# Patient Record
Sex: Female | Born: 1945 | Race: White | Hispanic: No | Smoking: Never smoker
Health system: Southern US, Community
[De-identification: ages and names within clinical notes are randomized; demographics above are authoritative.]

## PROBLEM LIST (undated history)

## (undated) DIAGNOSIS — F3289 Other specified depressive episodes: Secondary | ICD-10-CM

## (undated) DIAGNOSIS — E039 Hypothyroidism, unspecified: Secondary | ICD-10-CM

## (undated) DIAGNOSIS — I10 Essential (primary) hypertension: Secondary | ICD-10-CM

## (undated) DIAGNOSIS — I639 Cerebral infarction, unspecified: Secondary | ICD-10-CM

## (undated) DIAGNOSIS — J189 Pneumonia, unspecified organism: Secondary | ICD-10-CM

## (undated) DIAGNOSIS — E7800 Pure hypercholesterolemia, unspecified: Secondary | ICD-10-CM

## (undated) DIAGNOSIS — C801 Malignant (primary) neoplasm, unspecified: Secondary | ICD-10-CM

## (undated) DIAGNOSIS — D649 Anemia, unspecified: Secondary | ICD-10-CM

## (undated) DIAGNOSIS — G459 Transient cerebral ischemic attack, unspecified: Secondary | ICD-10-CM

## (undated) DIAGNOSIS — F329 Major depressive disorder, single episode, unspecified: Secondary | ICD-10-CM

## (undated) DIAGNOSIS — E119 Type 2 diabetes mellitus without complications: Secondary | ICD-10-CM

## (undated) DIAGNOSIS — Z8719 Personal history of other diseases of the digestive system: Secondary | ICD-10-CM

## (undated) HISTORY — DX: Essential (primary) hypertension: I10

## (undated) HISTORY — DX: Hypothyroidism, unspecified: E03.9

## (undated) HISTORY — DX: Type 2 diabetes mellitus without complications: E11.9

## (undated) HISTORY — PX: BACK SURGERY: SHX140

## (undated) HISTORY — DX: Other specified depressive episodes: F32.89

## (undated) HISTORY — DX: Major depressive disorder, single episode, unspecified: F32.9

## (undated) HISTORY — DX: Pure hypercholesterolemia, unspecified: E78.00

## (undated) HISTORY — PX: COLONOSCOPY: SHX174

## (undated) HISTORY — PX: EYE SURGERY: SHX253

## (undated) HISTORY — PX: TUBAL LIGATION: SHX77

## (undated) HISTORY — PX: CATARACT EXTRACTION, BILATERAL: SHX1313

---

## 2004-11-03 ENCOUNTER — Ambulatory Visit (HOSPITAL_COMMUNITY): Admission: RE | Admit: 2004-11-03 | Discharge: 2004-11-03 | Payer: Self-pay | Admitting: Internal Medicine

## 2004-11-03 ENCOUNTER — Ambulatory Visit: Payer: Self-pay | Admitting: Internal Medicine

## 2010-05-15 ENCOUNTER — Ambulatory Visit: Payer: Self-pay | Admitting: Internal Medicine

## 2010-05-15 ENCOUNTER — Ambulatory Visit (HOSPITAL_COMMUNITY): Admission: RE | Admit: 2010-05-15 | Discharge: 2010-05-15 | Payer: Self-pay | Admitting: Internal Medicine

## 2010-09-09 LAB — GLUCOSE, CAPILLARY: Glucose-Capillary: 171 mg/dL — ABNORMAL HIGH (ref 70–99)

## 2010-11-14 NOTE — Op Note (Signed)
[name], Carol Wood              ACCOUNT NO.:  [id number]   MEDICAL RECORD NO.:  [id number]          PATIENT TYPE:  AMB   LOCATION:  DAY                           FACILITY:  [facility]   PHYSICIAN:  Lionel December, M.D.    DATE OF BIRTH:  1945/12/25   DATE OF PROCEDURE:  11/03/2004  DATE OF DISCHARGE:                                 OPERATIVE REPORT   PROCEDURE:  Colonoscopy with polypectomy.   ENDOSCOPIST:  Lionel December, M.D.   INDICATIONS FOR PROCEDURE:  The patient is a 66 year old Caucasian female  who is here for a screening colonoscopy.  The procedure and risks were  reviewed with the patient and an informed consent was obtained.   PREOPERATIVE MEDICATIONS:  Demerol 50 mg IV, Versed 7 mg IV in divided dose.   FINDINGS/DESCRIPTION OF PROCEDURE:  The procedure was performed in the  endoscopy suite.  The patient's vital signs and O2 saturations were  monitored during the procedure and remained stable.  The patient was placed  in the left lateral recumbent position and a rectal examination was  performed.  She had a soft sentinel skin tag.  A digital exam was normal.  The Olympus videoscope was placed in the rectum and advanced under vision  into the sigmoid colon and beyond.  __________ the hepatic flexure.  There  was a small polyp which was snared and retrieved for histologic examination.  The scope was passed into the cecum which was identified by the ileocecal  valve.  The blunt cecum was coated with thick stool, not well seen; however,  there did not appear to be anything underneath.  Pictures taken for the  record.  As the scope was withdrawn, the colonic mucosa was once again  carefully examined.  There were no other abnormalities.  The rectal mucosa  similarly was normal.  The scope was retroflexed and examined in the rectum,  and small hemorrhoids were noted below the dentate line.  The endoscope was  straightened and withdrawn.   The patient tolerated the procedure  well.   FINAL DIAGNOSES:  1.  Single small polyp, snared from the hepatic flexure.  2.  Small external hemorrhoids.  3.  A skin tag.   RECOMMENDATIONS:  Standard instructions given.  I will be contacting the  patient with the biopsy results and further recommendations.      NR/MEDQ  D:  11/03/2004  T:  11/03/2004  Job:  [id number]   cc:   Doreen Beam  [address]  [place]  [place] [address]  Fax: [phone]

## 2011-09-01 ENCOUNTER — Encounter: Payer: Self-pay | Admitting: *Deleted

## 2011-09-02 ENCOUNTER — Ambulatory Visit (INDEPENDENT_AMBULATORY_CARE_PROVIDER_SITE_OTHER): Payer: Medicare Other | Admitting: Cardiology

## 2011-09-02 ENCOUNTER — Telehealth: Payer: Self-pay | Admitting: *Deleted

## 2011-09-02 ENCOUNTER — Encounter: Payer: Self-pay | Admitting: *Deleted

## 2011-09-02 ENCOUNTER — Encounter: Payer: Self-pay | Admitting: Cardiology

## 2011-09-02 VITALS — BP 143/82 | HR 77 | Ht 60.0 in | Wt 176.0 lb

## 2011-09-02 DIAGNOSIS — R0602 Shortness of breath: Secondary | ICD-10-CM

## 2011-09-02 DIAGNOSIS — R0600 Dyspnea, unspecified: Secondary | ICD-10-CM | POA: Insufficient documentation

## 2011-09-02 DIAGNOSIS — E119 Type 2 diabetes mellitus without complications: Secondary | ICD-10-CM

## 2011-09-02 DIAGNOSIS — E7800 Pure hypercholesterolemia, unspecified: Secondary | ICD-10-CM

## 2011-09-02 DIAGNOSIS — I10 Essential (primary) hypertension: Secondary | ICD-10-CM

## 2011-09-02 NOTE — Assessment & Plan Note (Signed)
The blood pressure is slightly higher than  target. No change in medications is indicated. We will continue with therapeutic lifestyle changes (TLC).  I suspect that weight loss will bring her to target.

## 2011-09-02 NOTE — Assessment & Plan Note (Signed)
The patient describes dyspnea that could easily be an anginal equivalent. Given this stress testing is indicated. She does not think she would be a little walk on a treadmill. Therefore, she will have a [employer].

## 2011-09-02 NOTE — Telephone Encounter (Signed)
lexiscan myoview Scheduled for 09-14-11 @ [facility] Checking percert

## 2011-09-02 NOTE — Assessment & Plan Note (Signed)
This is followed by Dr. Talmage Nap

## 2011-09-02 NOTE — Patient Instructions (Signed)
Your follow up will be based on your test results. Your physician recommends that you continue on your current medications as directed. Please refer to the Current Medication list given to you today. Your physician has requested that you have a lexiscan myoview. For further information please visit [email or web address]. Please follow instruction sheet, as given.  If the results of your test are normal or stable, you will receive a letter. If they are abnormal, the nurse will contact you by phone.  

## 2011-09-02 NOTE — Progress Notes (Signed)
HPI The patient presents for evaluation of dyspnea. She has not had prior cardiac history. However, she has had significant cardiovascular risk factors. She has a strong family history of coronary disease. She was referred by her endocrinologist to further consider coronary disease as an etiology for her dyspnea. The patient reports that she has noticed on occasion but she's breathing heavy. More she hears other people tell her that she looks like she is breathing heavy with exertion. She says she can play with her dogs and walk a fair distance to the mailbox and will be breathing heavy. She doesn't describe any chest pressure, neck or arm discomfort. She doesn't describe palpitations, presyncope or syncope. She's had no PND or orthopnea. She's had no weight gain or edema.  No Known Allergies  Current Outpatient Prescriptions  Medication Sig Dispense Refill  . aspirin 325 MG tablet Take 325 mg by mouth daily.      Marland Kitchen b complex vitamins tablet Take 1 tablet by mouth daily.      . Calcium Carbonate-Vit D-Min (CALTRATE 600+D PLUS) 600-400 MG-UNIT per tablet Take 1 tablet by mouth daily.      . Coenzyme Q10 (COQ10) 100 MG CAPS Take 1 capsule by mouth daily.      . fish oil-omega-3 fatty acids 1000 MG capsule Take 2 g by mouth 2 (two) times daily.       Marland Kitchen glipiZIDE (GLUCOTROL XL) 5 MG 24 hr tablet Take 5 mg by mouth daily.      Marland Kitchen glucosamine-chondroitin 500-400 MG tablet Take 1 tablet by mouth 2 (two) times daily.      Marland Kitchen levothyroxine (SYNTHROID, LEVOTHROID) 50 MCG tablet Take 50 mcg by mouth daily.      Marland Kitchen lisinopril (PRINIVIL,ZESTRIL) 20 MG tablet Take 20 mg by mouth daily.      . Multiple Vitamin (MULTIVITAMIN) tablet Take 1 tablet by mouth daily.      Marland Kitchen omeprazole (PRILOSEC) 20 MG capsule Take 1 capsule by mouth Daily.      Marland Kitchen PARoxetine (PAXIL) 20 MG tablet Take 20 mg by mouth daily.      . simvastatin (ZOCOR) 40 MG tablet Take 40 mg by mouth every evening.      . sitaGLIPtan-metformin (JANUMET)  50-1000 MG per tablet Take 1 tablet by mouth 2 (two) times daily with a meal.        Past Medical History  Diagnosis Date  . Type II or unspecified type diabetes mellitus without mention of complication, not stated as uncontrolled   . Unspecified essential hypertension   . Pure hypercholesterolemia   . Hypothyroidism   . Depressive disorder, not elsewhere classified     Past Surgical History  Procedure Date  . Back surgery     ROS:  As stated in the HPI and negative for all other systems.  PHYSICAL EXAM BP 143/82  Pulse 77  Ht 5' (1.524 m)  Wt 176 lb (79.833 kg)  BMI 34.37 kg/m2  SpO2 98% GENERAL:  Well appearing HEENT:  Pupils equal round and reactive, fundi not visualized, oral mucosa unremarkable NECK:  No jugular venous distention, waveform within normal limits, carotid upstroke brisk and symmetric, no bruits, no thyromegaly LYMPHATICS:  No cervical, inguinal adenopathy LUNGS:  Clear to auscultation bilaterally BACK:  No CVA tenderness CHEST:  Unremarkable HEART:  PMI not displaced or sustained,S1 and S2 within normal limits, no S3, no S4, no clicks, no rubs, no murmurs ABD:  Flat, positive bowel sounds normal in frequency in  pitch, no bruits, no rebound, no guarding, no midline pulsatile mass, no hepatomegaly, no splenomegaly EXT:  2 plus pulses throughout, no edema, no cyanosis no clubbing SKIN:  No rashes no nodules NEURO:  Cranial nerves II through XII grossly intact, motor grossly intact throughout PSYCH:  Cognitively intact, oriented to person place and time  EKG:  Sinus rhythm, rate 78, axis within normal limits, intervals within normal limits, no acute ST-T wave changes.  Poor anterior R wave progression. 09/02/2011  ASSESSMENT AND PLAN

## 2011-09-08 NOTE — Telephone Encounter (Signed)
Pt has [employer] MCR Complete.  WUJW#[id number] Exp 10-23-11

## 2011-09-14 DIAGNOSIS — R079 Chest pain, unspecified: Secondary | ICD-10-CM

## 2011-09-18 ENCOUNTER — Encounter: Payer: Self-pay | Admitting: *Deleted

## 2014-08-02 ENCOUNTER — Encounter (INDEPENDENT_AMBULATORY_CARE_PROVIDER_SITE_OTHER): Payer: Self-pay | Admitting: *Deleted

## 2014-09-03 ENCOUNTER — Other Ambulatory Visit (INDEPENDENT_AMBULATORY_CARE_PROVIDER_SITE_OTHER): Payer: Self-pay | Admitting: *Deleted

## 2014-09-03 ENCOUNTER — Ambulatory Visit (INDEPENDENT_AMBULATORY_CARE_PROVIDER_SITE_OTHER): Payer: Medicare Other | Admitting: Internal Medicine

## 2014-09-03 ENCOUNTER — Encounter (INDEPENDENT_AMBULATORY_CARE_PROVIDER_SITE_OTHER): Payer: Self-pay | Admitting: *Deleted

## 2014-09-03 ENCOUNTER — Encounter (INDEPENDENT_AMBULATORY_CARE_PROVIDER_SITE_OTHER): Payer: Self-pay | Admitting: Internal Medicine

## 2014-09-03 VITALS — BP 112/62 | HR 60 | Temp 98.6°F | Ht 61.6 in | Wt 177.8 lb

## 2014-09-03 DIAGNOSIS — R1314 Dysphagia, pharyngoesophageal phase: Secondary | ICD-10-CM

## 2014-09-03 DIAGNOSIS — Z800 Family history of malignant neoplasm of digestive organs: Secondary | ICD-10-CM

## 2014-09-03 DIAGNOSIS — R1310 Dysphagia, unspecified: Secondary | ICD-10-CM

## 2014-09-03 NOTE — Progress Notes (Signed)
   Subjective:    Patient ID: Carol Wood, female    DOB: 11/21/1945, 69 y.o.   MRN: [id number]  HPI Referred to our office by Dr. Woody Seller for dysphagia. She says she has had GERD. She has had acid reflux for at least 3-4 years. Started Omeprazole about 3 yrs ago and controlled her GERD for the most time. She says if she skipped a day she would have acid reflux.  Recently given an RX for Protonix but has not started y;e;t.  She says meats are giving her problems swallowing. Meats are slow to go down. She chews well and takes her time eating. Denies prior hx of dysphagia. Appetite is good. No weight loss. No abdominal pain. Usually has a BM once a day. She says she eats a lot of fiber. No melena or BRRB. Last colonoscopy was in 2011 with two polyps. Biopsy HYPERPLASTIC POLYP(S).NO ADENOMATOUS CHANGE OR MALIGNANCY IDENTIFIED.  2. Colon, polyp(s), proximal sigmoid : TUBULAR ADENOMA.NO HIGH GRADE DYSPLASIA OR MALIGNANCY IDENTIFIED.    Family hx of colon cancer in a brother age 67 (deceased). Family hx of stomach aunt and grandfather.   Review of Systems Widowed. Three boy. Two have CAD. One son in good health    Objective:   Physical Exam  Filed Vitals:   09/03/14 1105  Height: 5' 1.6" (1.565 m)  Weight: 177 lb 12.8 oz (80.65 kg)   Alert and oriented. Skin warm and dry. Oral mucosa is moist.   . Sclera anicteric, conjunctivae is pink. Thyroid not enlarged. No cervical lymphadenopathy. Lungs clear. Heart regular rate and rhythm.  Abdomen is soft. Bowel sounds are positive. No hepatomegaly. No abdominal masses felt. No tenderness.  No edema to lower extremities.         Assessment & Plan:  Dysphagia. Stricture needs to be ruled out as well as PU Will put on a recall for a colonoscopy in November.

## 2014-09-03 NOTE — Patient Instructions (Signed)
EGD/ED. The risks and benefits such as perforation, bleeding, and infection were reviewed with the patient and is agreeable. 

## 2014-09-06 ENCOUNTER — Ambulatory Visit (HOSPITAL_COMMUNITY)
Admission: RE | Admit: 2014-09-06 | Discharge: 2014-09-06 | Disposition: A | Discharge status: Home / Self Care | Payer: Medicare Other | Source: Ambulatory Visit | Attending: Internal Medicine | Admitting: Internal Medicine

## 2014-09-06 ENCOUNTER — Encounter (HOSPITAL_COMMUNITY): Payer: Self-pay | Admitting: *Deleted

## 2014-09-06 ENCOUNTER — Encounter (HOSPITAL_COMMUNITY)
Admission: RE | Disposition: A | Discharge status: Home / Self Care | Payer: Self-pay | Source: Ambulatory Visit | Attending: Internal Medicine

## 2014-09-06 DIAGNOSIS — E780 Pure hypercholesterolemia: Secondary | ICD-10-CM | POA: Diagnosis not present

## 2014-09-06 DIAGNOSIS — Z7982 Long term (current) use of aspirin: Secondary | ICD-10-CM | POA: Diagnosis not present

## 2014-09-06 DIAGNOSIS — E119 Type 2 diabetes mellitus without complications: Secondary | ICD-10-CM | POA: Diagnosis not present

## 2014-09-06 DIAGNOSIS — Z794 Long term (current) use of insulin: Secondary | ICD-10-CM | POA: Diagnosis not present

## 2014-09-06 DIAGNOSIS — E039 Hypothyroidism, unspecified: Secondary | ICD-10-CM | POA: Diagnosis not present

## 2014-09-06 DIAGNOSIS — I10 Essential (primary) hypertension: Secondary | ICD-10-CM | POA: Diagnosis not present

## 2014-09-06 DIAGNOSIS — F329 Major depressive disorder, single episode, unspecified: Secondary | ICD-10-CM | POA: Diagnosis not present

## 2014-09-06 DIAGNOSIS — K219 Gastro-esophageal reflux disease without esophagitis: Secondary | ICD-10-CM

## 2014-09-06 DIAGNOSIS — Z79899 Other long term (current) drug therapy: Secondary | ICD-10-CM | POA: Insufficient documentation

## 2014-09-06 DIAGNOSIS — K3189 Other diseases of stomach and duodenum: Secondary | ICD-10-CM | POA: Diagnosis not present

## 2014-09-06 DIAGNOSIS — K449 Diaphragmatic hernia without obstruction or gangrene: Secondary | ICD-10-CM | POA: Insufficient documentation

## 2014-09-06 DIAGNOSIS — R1310 Dysphagia, unspecified: Secondary | ICD-10-CM | POA: Diagnosis not present

## 2014-09-06 HISTORY — PX: ESOPHAGEAL DILATION: SHX303

## 2014-09-06 HISTORY — PX: ESOPHAGOGASTRODUODENOSCOPY: SHX5428

## 2014-09-06 LAB — GLUCOSE, CAPILLARY: GLUCOSE-CAPILLARY: 172 mg/dL — AB (ref 70–99)

## 2014-09-06 SURGERY — EGD (ESOPHAGOGASTRODUODENOSCOPY)
Anesthesia: Moderate Sedation

## 2014-09-06 MED ORDER — MEPERIDINE HCL 50 MG/ML IJ SOLN
INTRAMUSCULAR | Status: AC
  Filled 2014-09-06: qty 1

## 2014-09-06 MED ORDER — BUTAMBEN-TETRACAINE-BENZOCAINE 2-2-14 % EX AERO
INHALATION_SPRAY | CUTANEOUS | Status: DC | PRN
  Administered 2014-09-06: 2 via TOPICAL

## 2014-09-06 MED ORDER — MEPERIDINE HCL 50 MG/ML IJ SOLN
INTRAMUSCULAR | Status: DC | PRN
  Administered 2014-09-06 (×2): 25 mg via INTRAVENOUS

## 2014-09-06 MED ORDER — MIDAZOLAM HCL 5 MG/5ML IJ SOLN
INTRAMUSCULAR | Status: DC | PRN
  Administered 2014-09-06: 2 mg via INTRAVENOUS
  Administered 2014-09-06: 1 mg via INTRAVENOUS
  Administered 2014-09-06: 2 mg via INTRAVENOUS
  Administered 2014-09-06: 1 mg via INTRAVENOUS
  Administered 2014-09-06: 2 mg via INTRAVENOUS

## 2014-09-06 MED ORDER — STERILE WATER FOR IRRIGATION IR SOLN
Status: DC | PRN
  Administered 2014-09-06: 14:00:00

## 2014-09-06 MED ORDER — SODIUM CHLORIDE 0.9 % IV SOLN
INTRAVENOUS | Status: DC
  Administered 2014-09-06: 14:00:00 via INTRAVENOUS

## 2014-09-06 MED ORDER — MIDAZOLAM HCL 5 MG/5ML IJ SOLN
INTRAMUSCULAR | Status: AC
  Filled 2014-09-06: qty 10

## 2014-09-06 NOTE — H&P (Addendum)
Carol Wood is an 69 y.o. female.   Chief Complaint:  Patient is here for EGD and ED HPI:  Patient is 69 year-old Caucasian female who presents with history of dysphagia about 3 years. She has difficulty with solids. She points to suprasternal area soft bolus obstruction. She seemed to swallow better when she stays in her PPI. She's had heartburn regurgitation and cough for about 4 years. She denies nausea vomiting epigastric pain melena or weight loss.  Past Medical History  Diagnosis Date  . Type II or unspecified type diabetes mellitus without mention of complication, not stated as uncontrolled   . Unspecified essential hypertension   . Pure hypercholesterolemia   . Hypothyroidism   . Depressive disorder, not elsewhere classified     Past Surgical History  Procedure Laterality Date  . Back surgery    . Tubal ligation      Family History  Problem Relation Age of Onset  . Heart disease Son 47    CABG  . Heart disease Son 43    CABG  . Cancer Mother 10    Lung   Social History:  reports that she has never smoked. She has never used smokeless tobacco. She reports that she does not drink alcohol or use illicit drugs.  Allergies: No Known Allergies  Medications Prior to Admission  Medication Sig Dispense Refill  . acidophilus (RISAQUAD) CAPS capsule Take 1 capsule by mouth daily.    . APPLE CIDER VINEGAR PO Take 2 tablets by mouth daily.    Marland Kitchen aspirin 325 MG tablet Take 325 mg by mouth daily.    Marland Kitchen b complex vitamins tablet Take 1 tablet by mouth daily.    . cholecalciferol (VITAMIN D) 400 UNITS TABS tablet Take 400 Units by mouth daily.    . fish oil-omega-3 fatty acids 1000 MG capsule Take 2 g by mouth 2 (two) times daily.     Marland Kitchen glipiZIDE (GLUCOTROL XL) 10 MG 24 hr tablet Take 10 mg by mouth 2 (two) times daily.    Marland Kitchen glucosamine-chondroitin 500-400 MG tablet Take 1 tablet by mouth 2 (two) times daily.    . insulin glargine (LANTUS) 100 UNIT/ML injection Inject 10 Units into  the skin at bedtime. 10 units in evening    . levothyroxine (SYNTHROID, LEVOTHROID) 50 MCG tablet Take 50 mcg by mouth daily.    Marland Kitchen lisinopril (PRINIVIL,ZESTRIL) 20 MG tablet Take 20 mg by mouth 2 (two) times daily.     . Magnesium 250 MG TABS Take 1 tablet by mouth daily.    . metFORMIN (GLUCOPHAGE) 500 MG tablet Take 1,000 mg by mouth 2 (two) times daily with a meal.    . Misc Natural Products (SUPER GRAPEFRUIT N FIBER DIET) TABS Take 3 tablets by mouth 2 (two) times daily.    . Multiple Vitamin (MULTIVITAMIN) tablet Take 1 tablet by mouth daily.    . Multiple Vitamins-Minerals (VITEYES AREDS FORMULA PO) Take 1 tablet by mouth 2 (two) times daily.    Marland Kitchen omeprazole (PRILOSEC) 20 MG capsule Take 1 capsule by mouth Daily.    Marland Kitchen PARoxetine (PAXIL) 20 MG tablet Take 20 mg by mouth daily.      Results for orders placed or performed during the hospital encounter of 09/06/14 (from the past 48 hour(s))  Glucose, capillary     Status: Abnormal   Collection Time: 09/06/14  1:52 PM  Result Value Ref Range   Glucose-Capillary 172 (H) 70 - 99 mg/dL   No results found.  ROS  Blood pressure 170/77, pulse 82, temperature 98 F (36.7 C), temperature source Oral, resp. rate 18, SpO2 98 %. Physical Exam  Constitutional: She appears well-developed and well-nourished.  HENT:  Mouth/Throat: Oropharynx is clear and moist.  Eyes: Conjunctivae are normal. No scleral icterus.  Neck: No thyromegaly present.  Cardiovascular: Regular rhythm and normal heart sounds.   No murmur heard. Respiratory: Effort normal and breath sounds normal.  GI: Soft. She exhibits no distension and no mass. There is no tenderness.  Musculoskeletal: She exhibits no edema.  Lymphadenopathy:    She has no cervical adenopathy.  Neurological: She is alert.  Skin: Skin is warm and dry.     Assessment/Plan  Solid food dysphagia in patient with chronic GERD.  EGD with ED.  REHMAN,NAJEEB U 09/06/2014, 2:23 PM

## 2014-09-06 NOTE — Discharge Instructions (Signed)
Resume usual medications and diet.  No driving for 24 hours  Physician will call with biopsy results.  Dysphagia Swallowing problems (dysphagia) occur when solids and liquids seem to stick in your throat on the way down to your stomach, or the food takes longer to get to the stomach. Other symptoms include regurgitating food, noises coming from the throat, chest discomfort with swallowing, and a feeling of fullness or the feeling of something being stuck in your throat when swallowing. When blockage in your throat is complete, it may be associated with drooling. CAUSES  Problems with swallowing may occur because of problems with the muscles. The food cannot be propelled in the usual manner into your stomach. You may have ulcers, scar tissue, or inflammation in the tube down which food travels from your mouth to your stomach (esophagus), which blocks food from passing normally into the stomach. Causes of inflammation include:  Acid reflux from your stomach into your esophagus.  Infection.  Radiation treatment for cancer.  Medicines taken without enough fluids to wash them down into your stomach. You may have nerve problems that prevent signals from being sent to the muscles of your esophagus to contract and move your food down to your stomach. Globus pharyngeus is a relatively common problem in which there is a sense of an obstruction or difficulty in swallowing, without any physical abnormalities of the swallowing passages being found. This problem usually improves over time with reassurance and testing to rule out other causes. DIAGNOSIS Dysphagia can be diagnosed and its cause can be determined by tests in which you swallow a white substance that helps illuminate the inside of your throat (contrast medium) while X-rays are taken. Sometimes a flexible telescope that is inserted down your throat (endoscopy) to look at your esophagus and stomach is used. TREATMENT   If the dysphagia is caused by  acid reflux or infection, medicines may be used.  If the dysphagia is caused by problems with your swallowing muscles, swallowing therapy may be used to help you strengthen your swallowing muscles.  If the dysphagia is caused by a blockage or mass, procedures to remove the blockage may be done. HOME CARE INSTRUCTIONS  Try to eat soft food that is easier to swallow and check your weight on a daily basis to be sure that it is not decreasing.  Be sure to drink liquids when sitting upright (not lying down). SEEK MEDICAL CARE IF:  You are losing weight because you are unable to swallow.  You are coughing when you drink liquids (aspiration).  You are coughing up partially digested food. SEEK IMMEDIATE MEDICAL CARE IF:  You are unable to swallow your own saliva .  You are having shortness of breath or a fever, or both.  You have a hoarse voice along with difficulty swallowing. MAKE SURE YOU:  Understand these instructions.  Will watch your condition.  Will get help right away if you are not doing well or get worse. Document Released: 06/12/2000 Document Revised: 10/30/2013 Document Reviewed: 12/02/2012 [facility] Patient Information 2015 [place], [place]. This information is not intended to replace advice given to you by your health care provider. Make sure you discuss any questions you have with your health care provider.  Esophagogastroduodenoscopy Care After Refer to this sheet in the next few weeks. These instructions provide you with information on caring for yourself after your procedure. Your caregiver may also give you more specific instructions. Your treatment has been planned according to current medical practices, but problems  sometimes occur. Call your caregiver if you have any problems or questions after your procedure.  HOME CARE INSTRUCTIONS  Do not eat or drink anything until the numbing medicine (local anesthetic) has worn off and your gag reflex has returned. You will  know that the local anesthetic has worn off when you can swallow comfortably.  Do not drive for 12 hours after the procedure or as directed by your caregiver.  Only take medicines as directed by your caregiver. SEEK MEDICAL CARE IF:   You cannot stop coughing.  You are not urinating at all or less than usual. SEEK IMMEDIATE MEDICAL CARE IF:  You have difficulty swallowing.  You cannot eat or drink.  You have worsening throat or chest pain.  You have dizziness, lightheadedness, or you faint.  You have nausea or vomiting.  You have chills.  You have a fever.  You have severe abdominal pain.  You have black, tarry, or bloody stools. Document Released: 06/01/2012 Document Reviewed: 06/01/2012 [facility] Patient Information 2015 [place]. This information is not intended to replace advice given to you by your health care provider. Make sure you discuss any questions you have with your health care provider.

## 2014-09-06 NOTE — Op Note (Signed)
EGD PROCEDURE REPORT  PATIENT:  Carol Wood  MR#:  [id number] Birthdate:  27-Nov-1945, 69 y.o., female Endoscopist:  Dr. Rogene Houston, MD Referred By:  Dr. Glenda Chroman, MD Procedure Date: 09/06/2014  Procedure:   EGD with ED  Indications:   Patient is 69 year old Caucasian female was chronic GERD and now presents with several month history of solid food dysphagia.            Informed Consent:  The risks, benefits, alternatives & imponderables which include, but are not limited to, bleeding, infection, perforation, drug reaction and potential missed lesion have been reviewed.  The potential for biopsy, lesion removal, esophageal dilation, etc. have also been discussed.  Questions have been answered.  All parties agreeable.  Please see history & physical in medical record for more information.  Medications:  Demerol 50 mg IV Versed 7 mg IV Cetacaine spray topically for oropharyngeal anesthesia  Description of procedure:  The endoscope was introduced through the mouth and advanced to the second portion of the duodenum without difficulty or limitations. The mucosal surfaces were surveyed very carefully during advancement of the scope and upon withdrawal.  Findings:  Esophagus:   Mucosa of the esophagus was normal. GE junction was wavy or serrated. No ring or stricture was identified. GEJ:  33 cm Hiatus:  37 cm Stomach:   Stomach was empty and distended very well with insufflation. Folds in the proximal stomach were normal. Examination mucosa gastric body, antrum, pyloric channel, angularis fundus and cardia was normal. Duodenum:   Normal bulbar and post bulbar mucosa.  Therapeutic/Diagnostic Maneuvers Performed:    Esophagus was dilated by passing 54 [place] Maloney dilator to full insertion.  Endoscope was passed again in no disruption noted to esophageal mucosa.  Biopsy was taken from GE junction to rule out short segment Barrett's esophagus.  Complications:   None  Impression:  Small sliding hiatal hernia without ring or stricture formation.  Serrated GE junction. Biopsy was taken post dilation to rule out short segment Barrett's.  Esophagus dilated by passing 54 [place] Maloney dilator but no mucosal disruption induced.  Recommendations:   Standard instructions given.  I will be contacting patient with biopsy results and further recommendations.  Baker Kogler U  09/06/2014  3:13 PM  CC: Dr. Glenda Chroman., MD & Dr. Rayne Du ref. provider found

## 2014-09-10 ENCOUNTER — Encounter (HOSPITAL_COMMUNITY): Payer: Self-pay | Admitting: Internal Medicine

## 2014-09-13 ENCOUNTER — Encounter (INDEPENDENT_AMBULATORY_CARE_PROVIDER_SITE_OTHER): Payer: Self-pay | Admitting: *Deleted

## 2014-09-19 ENCOUNTER — Encounter (INDEPENDENT_AMBULATORY_CARE_PROVIDER_SITE_OTHER): Payer: Self-pay

## 2014-12-24 ENCOUNTER — Other Ambulatory Visit: Payer: Self-pay

## 2015-05-01 ENCOUNTER — Encounter (INDEPENDENT_AMBULATORY_CARE_PROVIDER_SITE_OTHER): Payer: Self-pay | Admitting: *Deleted

## 2015-05-15 ENCOUNTER — Telehealth (INDEPENDENT_AMBULATORY_CARE_PROVIDER_SITE_OTHER): Payer: Self-pay | Admitting: *Deleted

## 2015-05-15 DIAGNOSIS — Z1211 Encounter for screening for malignant neoplasm of colon: Secondary | ICD-10-CM

## 2015-05-15 NOTE — Telephone Encounter (Signed)
Referring MD/PCP: vyas   Procedure: tcs  Reason/Indication:  fam hx colon ca  Has patient had this procedure before?  Yes, 2011 -- paper cahrt  If so, when, by whom and where?    Is there a family history of colon cancer?  Yes, brother  Who?  What age when diagnosed?    Is patient diabetic?   yes      Does patient have prosthetic heart valve or mechanical valve?  no  Do you have a pacemaker?  no  Has patient ever had endocarditis? no  Has patient had joint replacement within last 12 months?  no  Does patient tend to be constipated or take laxatives? no  Does patient have a history of alcohol/drug use?  no  Is patient on Coumadin, Plavix and/or Aspirin? yes  Medications: asa 325 mg daily, metformin 500 mg 2 in am & 2 in pm, glipizide 10 mg bid (am & pm), lantus 10 units nightly, Lisinopril 20 mg bid, levothyroxine .05 mg daily, paroxetine 20 mg nightly  Allergies: nkda  Medication Adjustment: asa 2 days, hold DM med evening before and morning of  Procedure date & time: 06/12/15 at 1200

## 2015-05-15 NOTE — Telephone Encounter (Signed)
Patient needs trilyte 

## 2015-05-16 ENCOUNTER — Other Ambulatory Visit (INDEPENDENT_AMBULATORY_CARE_PROVIDER_SITE_OTHER): Payer: Self-pay | Admitting: *Deleted

## 2015-05-16 DIAGNOSIS — Z800 Family history of malignant neoplasm of digestive organs: Secondary | ICD-10-CM

## 2015-05-17 MED ORDER — PEG 3350-KCL-NA BICARB-NACL 420 G PO SOLR: 4000.0000 mL | Freq: Once | ORAL | Status: DC

## 2015-05-17 NOTE — Telephone Encounter (Signed)
agree

## 2015-05-20 ENCOUNTER — Telehealth (INDEPENDENT_AMBULATORY_CARE_PROVIDER_SITE_OTHER): Payer: Self-pay | Admitting: *Deleted

## 2015-05-20 NOTE — Telephone Encounter (Signed)
Referring MD/PCP: vyas   Procedure: tcs  Reason/Indication:  fam hx colon ca  Has patient had this procedure before?  Yes, 2011 -- paper chart  If so, when, by whom and where?    Is there a family history of colon cancer?  Yes, brother  Who?  What age when diagnosed?    Is patient diabetic?   yes      Does patient have prosthetic heart valve or mechanical valve?  no  Do you have a pacemaker?  no  Has patient ever had endocarditis? no  Has patient had joint replacement within last 12 months?  no  Does patient tend to be constipated or take laxatives? no  Does patient have a history of alcohol/drug use?  no  Is patient on Coumadin, Plavix and/or Aspirin? yes  Medications: asa 325 mg daily, metformin 500 mg 2 in am & 2 in pm, glipizide 10 mg bid, lantus 20 units nightly, lisinopril 20 mg bid, levothyroxine 0.05 mg daily, paroxetine 20 mg nightly  Allergies: nkda  Medication Adjustment: asa 2 days, hold DM meds evening before and morning of  Procedure date & time: 06/12/15 at 1200

## 2015-05-21 NOTE — Telephone Encounter (Signed)
agree

## 2015-07-09 ENCOUNTER — Encounter (INDEPENDENT_AMBULATORY_CARE_PROVIDER_SITE_OTHER): Payer: Self-pay | Admitting: *Deleted

## 2015-07-09 ENCOUNTER — Telehealth (INDEPENDENT_AMBULATORY_CARE_PROVIDER_SITE_OTHER): Payer: Self-pay | Admitting: *Deleted

## 2015-07-09 NOTE — Telephone Encounter (Signed)
agree

## 2015-07-09 NOTE — Telephone Encounter (Signed)
Referring MD/PCP: vyas   Procedure: tcs  Reason/Indication:  fam hx colon ca  Has patient had this procedure before?  Yes, 2011 -- paper chart  If so, when, by whom and where?    Is there a family history of colon cancer?  Yes, brother  Who?  What age when diagnosed?    Is patient diabetic?   yes      Does patient have prosthetic heart valve or mechanical valve?  no  Do you have a pacemaker?  no  Has patient ever had endocarditis? no  Has patient had joint replacement within last 12 months?  no  Does patient tend to be constipated or take laxatives? no  Does patient have a history of alcohol/drug use?  no  Is patient on Coumadin, Plavix and/or Aspirin? yes  Medications: asa 325 mg daily, metformin 500 mg 2 in am & 2 in pm, glipizide 10 mg bid, lantus 20 units nightly, lisinopril 20 mg bid, levothyroxine 0.05 mg daily, paroxetine 20 mg nightly  Allergies: nkda  Medication Adjustment: asa 2 days, hold DM meds evening before & morning of  Procedure date & time: 07/31/15 at 1030

## 2015-07-31 ENCOUNTER — Ambulatory Visit (HOSPITAL_COMMUNITY)
Admission: RE | Admit: 2015-07-31 | Discharge: 2015-07-31 | Disposition: A | Discharge status: Home / Self Care | Payer: Medicare Other | Source: Ambulatory Visit | Attending: Internal Medicine | Admitting: Internal Medicine

## 2015-07-31 ENCOUNTER — Encounter (HOSPITAL_COMMUNITY)
Admission: RE | Disposition: A | Discharge status: Home / Self Care | Payer: Self-pay | Source: Ambulatory Visit | Attending: Internal Medicine

## 2015-07-31 ENCOUNTER — Encounter (HOSPITAL_COMMUNITY): Payer: Self-pay | Admitting: *Deleted

## 2015-07-31 DIAGNOSIS — Z800 Family history of malignant neoplasm of digestive organs: Secondary | ICD-10-CM | POA: Diagnosis not present

## 2015-07-31 DIAGNOSIS — E039 Hypothyroidism, unspecified: Secondary | ICD-10-CM | POA: Insufficient documentation

## 2015-07-31 DIAGNOSIS — Z86010 Personal history of colonic polyps: Secondary | ICD-10-CM | POA: Diagnosis not present

## 2015-07-31 DIAGNOSIS — D122 Benign neoplasm of ascending colon: Secondary | ICD-10-CM | POA: Insufficient documentation

## 2015-07-31 DIAGNOSIS — E7800 Pure hypercholesterolemia, unspecified: Secondary | ICD-10-CM | POA: Diagnosis not present

## 2015-07-31 DIAGNOSIS — I10 Essential (primary) hypertension: Secondary | ICD-10-CM | POA: Insufficient documentation

## 2015-07-31 DIAGNOSIS — Z79899 Other long term (current) drug therapy: Secondary | ICD-10-CM | POA: Insufficient documentation

## 2015-07-31 DIAGNOSIS — Z7982 Long term (current) use of aspirin: Secondary | ICD-10-CM | POA: Insufficient documentation

## 2015-07-31 DIAGNOSIS — Z1211 Encounter for screening for malignant neoplasm of colon: Secondary | ICD-10-CM | POA: Insufficient documentation

## 2015-07-31 DIAGNOSIS — Z794 Long term (current) use of insulin: Secondary | ICD-10-CM | POA: Diagnosis not present

## 2015-07-31 DIAGNOSIS — Q438 Other specified congenital malformations of intestine: Secondary | ICD-10-CM

## 2015-07-31 DIAGNOSIS — E119 Type 2 diabetes mellitus without complications: Secondary | ICD-10-CM | POA: Insufficient documentation

## 2015-07-31 DIAGNOSIS — F329 Major depressive disorder, single episode, unspecified: Secondary | ICD-10-CM | POA: Insufficient documentation

## 2015-07-31 DIAGNOSIS — Z801 Family history of malignant neoplasm of trachea, bronchus and lung: Secondary | ICD-10-CM | POA: Insufficient documentation

## 2015-07-31 HISTORY — PX: COLONOSCOPY: SHX5424

## 2015-07-31 LAB — GLUCOSE, CAPILLARY: Glucose-Capillary: 249 mg/dL — ABNORMAL HIGH (ref 65–99)

## 2015-07-31 SURGERY — COLONOSCOPY
Anesthesia: Moderate Sedation

## 2015-07-31 MED ORDER — MIDAZOLAM HCL 5 MG/5ML IJ SOLN
INTRAMUSCULAR | Status: DC | PRN
  Administered 2015-07-31: 1 mg via INTRAVENOUS
  Administered 2015-07-31 (×2): 2 mg via INTRAVENOUS

## 2015-07-31 MED ORDER — MEPERIDINE HCL 50 MG/ML IJ SOLN
INTRAMUSCULAR | Status: AC
  Filled 2015-07-31: qty 1

## 2015-07-31 MED ORDER — STERILE WATER FOR IRRIGATION IR SOLN
Status: DC | PRN
  Administered 2015-07-31: 11:00:00

## 2015-07-31 MED ORDER — MEPERIDINE HCL 50 MG/ML IJ SOLN
INTRAMUSCULAR | Status: DC | PRN
  Administered 2015-07-31 (×2): 25 mg via INTRAVENOUS

## 2015-07-31 MED ORDER — SODIUM CHLORIDE 0.9 % IV SOLN
INTRAVENOUS | Status: DC
  Administered 2015-07-31: 10:00:00 via INTRAVENOUS

## 2015-07-31 MED ORDER — MIDAZOLAM HCL 5 MG/5ML IJ SOLN
INTRAMUSCULAR | Status: AC
  Filled 2015-07-31: qty 10

## 2015-07-31 NOTE — H&P (Signed)
Carol Wood is an 70 y.o. female.   Chief Complaint:  Patient is here for colonoscopy. HPI:  Patient is 70 year old Caucasian female was history of colonic adenoma and family history of CRC is here for surveillance colonoscopy. Last colonoscopy was in November 2011 with removal of 2 polyps and one was tubular adenoma. She denies abdominal pain change in bilateral percent.  Family history significant for CRC in brother who was diagnosed at age 52 and died within couple of years of metastatic disease. She also has first cousin with history of CRC.  Past Medical History  Diagnosis Date  . Type II or unspecified type diabetes mellitus without mention of complication, not stated as uncontrolled   . Unspecified essential hypertension   . Pure hypercholesterolemia   . Hypothyroidism   . Depressive disorder, not elsewhere classified     Past Surgical History  Procedure Laterality Date  . Back surgery    . Tubal ligation    . Esophagogastroduodenoscopy N/A 09/06/2014    Procedure: ESOPHAGOGASTRODUODENOSCOPY (EGD);  Surgeon: Rogene Houston, MD;  Location: [facility];  Service: Endoscopy;  Laterality: N/A;  220  . Esophageal dilation N/A 09/06/2014    Procedure: ESOPHAGEAL DILATION;  Surgeon: Rogene Houston, MD;  Location: [facility];  Service: Endoscopy;  Laterality: N/A;  . Colonoscopy      Family History  Problem Relation Age of Onset  . Heart disease Son 44    CABG  . Heart disease Son 25    CABG  . Cancer Mother 53    Lung  . Colon cancer Brother    Social History:  reports that she has never smoked. She has never used smokeless tobacco. She reports that she does not drink alcohol or use illicit drugs.  Allergies: No Known Allergies  Medications Prior to Admission  Medication Sig Dispense Refill  . acidophilus (RISAQUAD) CAPS capsule Take 1 capsule by mouth daily.    Marland Kitchen aspirin 325 MG tablet Take 325 mg by mouth daily.    Marland Kitchen b complex vitamins tablet Take 1 tablet by mouth  daily.    . fish oil-omega-3 fatty acids 1000 MG capsule Take 2 g by mouth 2 (two) times daily.     Marland Kitchen glipiZIDE (GLUCOTROL XL) 10 MG 24 hr tablet Take 10 mg by mouth 2 (two) times daily.    Marland Kitchen glucosamine-chondroitin 500-400 MG tablet Take 1 tablet by mouth 2 (two) times daily.    . insulin glargine (LANTUS) 100 UNIT/ML injection Inject 10 Units into the skin at bedtime. 10 units in evening    . levothyroxine (SYNTHROID, LEVOTHROID) 50 MCG tablet Take 50 mcg by mouth daily.    Marland Kitchen lisinopril (PRINIVIL,ZESTRIL) 20 MG tablet Take 20 mg by mouth 2 (two) times daily.     . metFORMIN (GLUCOPHAGE) 500 MG tablet Take 1,000 mg by mouth 2 (two) times daily with a meal.    . Multiple Vitamins-Minerals (VITEYES AREDS FORMULA PO) Take 1 tablet by mouth 2 (two) times daily.    . pantoprazole (PROTONIX) 40 MG tablet Take 40 mg by mouth daily.    . polyethylene glycol-electrolytes (NULYTELY/GOLYTELY) 420 G solution Take 4,000 mLs by mouth once. 4000 mL 0  . Calcium Citrate-Vitamin D (CALCIUM + D PO) Take 1 tablet by mouth daily.      No results found for this or any previous visit (from the past 48 hour(s)). No results found.  ROS  Blood pressure 125/73, pulse 84, temperature 97.7 F (36.5 C),  temperature source Oral, resp. rate 16, height 5' 1.5" (1.562 m), weight 167 lb (75.751 kg), SpO2 100 %. Physical Exam  Constitutional: She appears well-developed and well-nourished.  HENT:  Mouth/Throat: Oropharynx is clear and moist.  Eyes: Conjunctivae are normal. No scleral icterus.  Neck: No thyromegaly present.  Cardiovascular: Normal rate, regular rhythm and normal heart sounds.   No murmur heard. GI: Soft. She exhibits no distension and no mass. There is no tenderness.  Musculoskeletal: She exhibits no edema.  Lymphadenopathy:    She has no cervical adenopathy.  Neurological: She is alert.  Skin: Skin is warm and dry.     Assessment/Plan History of colonic adenoma. Family history of  CRC. Surveillance colonoscopy.  Rogene Houston, MD 07/31/2015, 10:26 AM

## 2015-07-31 NOTE — Op Note (Signed)
COLONOSCOPY PROCEDURE REPORT  PATIENT:  Carol Wood  MR#:  [id number] Birthdate:  01-Jan-1946, 70 y.o., female Endoscopist:  Dr. Rogene Houston, MD Referred By:  Dr. Glenda Chroman, MD Procedure Date: 07/31/2015  Procedure:   Colonoscopy  Indications:  Patient is 70 year old Caucasian female with history of colonic polyp and family history of CRC in brother at age 50 and first cousin.  Informed Consent:  The procedure and risks were reviewed with the patient and informed consent was obtained.  Medications:  Demerol 50 mg IV Versed 7 mg IV  First dose administered at 1032 Last dose administered at 1039  Description of procedure:  After a digital rectal exam was performed, that colonoscope was advanced from the anus through the rectum and colon to the area of the cecum, ileocecal valve and appendiceal orifice. The cecum was deeply intubated. These structures were well-seen and photographed for the record. From the level of the cecum and ileocecal valve, the scope was slowly and cautiously withdrawn. The mucosal surfaces were carefully surveyed utilizing scope tip to flexion to facilitate fold flattening as needed. The scope was pulled down into the rectum where a thorough exam including retroflexion was performed.  Findings:   Prep satisfactory. Redundant colon. 5 mm polyp cold snared from hepatic flexure. Mucosa of rest of the colon and rectum was normal. Thickened anoderm.    Therapeutic/Diagnostic Maneuvers Performed:  See above  Complications:   None  EBL:  Minimal  Cecal Withdrawal Time:   8 minutes  Impression:   Examination performed to cecum.  5 mm polyp cold snared from ascending colon.   Recommendations:  Standard instructions given. I will contact patient with biopsy results and further recommendations.  Arianny Pun U  07/31/2015 11:25 AM  CC: Dr. Glenda Chroman., MD & Dr. Rayne Du ref. provider found

## 2015-07-31 NOTE — Discharge Instructions (Signed)
Resume usual medications and diet. °No driving for 24 hours. °Physician will call with biopsy results.Colonoscopy, Care After °Refer to this sheet in the next few weeks. These instructions provide you with information on caring for yourself after your procedure. Your health care provider may also give you more specific instructions. Your treatment has been planned according to current medical practices, but problems sometimes occur. Call your health care provider if you have any problems or questions after your procedure. °WHAT TO EXPECT AFTER THE PROCEDURE  °After your procedure, it is typical to have the following: °· A small amount of blood in your stool. °· Moderate amounts of gas and mild abdominal cramping or bloating. °HOME CARE INSTRUCTIONS °· Do not drive, operate machinery, or sign important documents for 24 hours. °· You may shower and resume your regular physical activities, but move at a slower pace for the first 24 hours. °· Take frequent rest periods for the first 24 hours. °· Walk around or put a warm pack on your abdomen to help reduce abdominal cramping and bloating. °· Drink enough fluids to keep your urine clear or pale yellow. °· You may resume your normal diet as instructed by your health care provider. Avoid heavy or fried foods that are hard to digest. °· Avoid drinking alcohol for 24 hours or as instructed by your health care provider. °· Only take over-the-counter or prescription medicines as directed by your health care provider. °· If a tissue sample (biopsy) was taken during your procedure: °¨ Do not take aspirin or blood thinners for 7 days, or as instructed by your health care provider. °¨ Do not drink alcohol for 7 days, or as instructed by your health care provider. °¨ Eat soft foods for the first 24 hours. °SEEK MEDICAL CARE IF: °You have persistent spotting of blood in your stool 2-3 days after the procedure. °SEEK IMMEDIATE MEDICAL CARE IF: °· You have more than a small spotting  of blood in your stool. °· You pass large blood clots in your stool. °· Your abdomen is swollen (distended). °· You have nausea or vomiting. °· You have a fever. °· You have increasing abdominal pain that is not relieved with medicine. °  °This information is not intended to replace advice given to you by your health care provider. Make sure you discuss any questions you have with your health care provider. °  °Document Released: 01/28/2004 Document Revised: 04/05/2013 Document Reviewed: 02/20/2013 °Elsevier Interactive Patient Education ©2016 [employer] ° °

## 2015-08-05 ENCOUNTER — Encounter (HOSPITAL_COMMUNITY): Payer: Self-pay | Admitting: Internal Medicine

## 2016-07-01 ENCOUNTER — Encounter (INDEPENDENT_AMBULATORY_CARE_PROVIDER_SITE_OTHER): Payer: Self-pay | Admitting: Internal Medicine

## 2016-07-01 ENCOUNTER — Ambulatory Visit (INDEPENDENT_AMBULATORY_CARE_PROVIDER_SITE_OTHER): Payer: Medicare Other | Admitting: Internal Medicine

## 2016-07-01 VITALS — BP 112/80 | HR 60 | Temp 97.6°F | Ht 61.0 in | Wt 151.9 lb

## 2016-07-01 DIAGNOSIS — R110 Nausea: Secondary | ICD-10-CM | POA: Diagnosis not present

## 2016-07-01 DIAGNOSIS — B349 Viral infection, unspecified: Secondary | ICD-10-CM | POA: Diagnosis not present

## 2016-07-01 NOTE — Patient Instructions (Signed)
Bland diet    Force fluids

## 2016-07-01 NOTE — Progress Notes (Signed)
   Subjective:    Patient ID: Carol Wood, female    DOB: 31-Jul-1945, 71 y.o.   MRN: [id number]  HPI  Here today for f/u after recent visit to the ED on 06/25/2016 and stayed a couple of nights. She had been sick for a couple of weeks with an URI.  She saw Dr. Woody Seller for a earache. She was given Ampicillin. She also received a shot of cortisone and told to take decongestions.  She started to have nausea and vomiting. She did not have a fever. She presented to the ED via EMS. She had no strength. She says she was dehydrated. Her WBC ct was slightly elevated. She says she thinks she had a virus. Today she is better. She is not 100%, but 80%. She does have some dizziness if she stands quickly. She has appt with ENT for Mastoiditis.  She has not taken anything for nausea since Saturday.  Diabetic x 20 yrs.  06/24/2016 HIDA scan normal.  06/23/2016 US abdomen complete: nausea: Normal  07/31/2015 Colonoscopy: Hx of colon polyps, Family hx of CRC in brother at age 49 and 1st cousin:   Examination performed to cecum.  5 mm polyp cold snared from ascending colon.  Polyp was a tubular adenoma. Results reviewed with patient. Next colonoscopy in 5 years because of family history of CRC in brother.  06/25/2016 H and H 12.6 and 38.7, MCV 88.6 Urinalysis was normal.      3/0/2016:   EGD with ED  Indications:   Patient is 71 year old Caucasian female was chronic GERD and now presents with several month history of solid food dysphagia.                                                                                                                        Impression:  Small sliding hiatal hernia without ring or stricture formation.  Serrated GE junction. Biopsy was taken post dilation to rule out short segment Barrett's.  Esophagus dilated by passing 54 French Maloney dilator but no mucosal disruption induced.  Biopsy from GE junction is negative for Barrett's Results reviewed with patient Review  of Systems     Objective:   Physical Exam Blood pressure 112/80, pulse 60, temperature 97.6 F (36.4 C), height 5\' 1"  (1.549 m), weight 151 lb 14.4 oz (68.9 kg). Alert and oriented. Skin warm and dry. Oral mucosa is moist.   . Sclera anicteric, conjunctivae is pink. Thyroid not enlarged. No cervical lymphadenopathy. Lungs clear. Heart regular rate and rhythm.  Abdomen is soft. Bowel sounds are positive. No hepatomegaly. No abdominal masses felt. No tenderness.  No edema to lower extremities.         Assessment & Plan:  Viral syndrome. Continue to force liquids. Bland diet for next few days. NM Emptying study for intermittent nausea. HIDA scan was normal. Hx of diabetes x 20 yrs.

## 2016-07-07 ENCOUNTER — Encounter (INDEPENDENT_AMBULATORY_CARE_PROVIDER_SITE_OTHER): Payer: Self-pay

## 2016-07-20 ENCOUNTER — Encounter (HOSPITAL_COMMUNITY)
Admission: RE | Admit: 2016-07-20 | Discharge: 2016-07-20 | Disposition: A | Discharge status: Home / Self Care | Payer: Medicare Other | Source: Ambulatory Visit | Attending: Internal Medicine | Admitting: Internal Medicine

## 2016-07-20 ENCOUNTER — Encounter (HOSPITAL_COMMUNITY): Payer: Self-pay

## 2016-07-20 DIAGNOSIS — K30 Functional dyspepsia: Secondary | ICD-10-CM | POA: Diagnosis not present

## 2016-07-20 DIAGNOSIS — R110 Nausea: Secondary | ICD-10-CM | POA: Diagnosis not present

## 2016-07-20 HISTORY — DX: Malignant (primary) neoplasm, unspecified: C80.1

## 2016-07-20 MED ORDER — TECHNETIUM TC 99M SULFUR COLLOID
2.0000 | Freq: Once | INTRAVENOUS | Status: AC | PRN
  Administered 2016-07-20: 2 via ORAL

## 2016-07-27 ENCOUNTER — Ambulatory Visit (INDEPENDENT_AMBULATORY_CARE_PROVIDER_SITE_OTHER): Payer: Medicare Other | Admitting: Otolaryngology

## 2016-07-27 DIAGNOSIS — R42 Dizziness and giddiness: Secondary | ICD-10-CM | POA: Diagnosis not present

## 2016-07-27 DIAGNOSIS — H903 Sensorineural hearing loss, bilateral: Secondary | ICD-10-CM

## 2016-07-31 ENCOUNTER — Encounter: Payer: Self-pay | Admitting: Internal Medicine

## 2017-07-29 ENCOUNTER — Ambulatory Visit (INDEPENDENT_AMBULATORY_CARE_PROVIDER_SITE_OTHER): Payer: Medicare Other | Admitting: Otolaryngology

## 2017-07-29 DIAGNOSIS — H903 Sensorineural hearing loss, bilateral: Secondary | ICD-10-CM

## 2018-07-28 ENCOUNTER — Ambulatory Visit (INDEPENDENT_AMBULATORY_CARE_PROVIDER_SITE_OTHER): Payer: Medicare Other | Admitting: Otolaryngology

## 2018-07-28 DIAGNOSIS — H903 Sensorineural hearing loss, bilateral: Secondary | ICD-10-CM | POA: Diagnosis not present

## 2018-08-10 ENCOUNTER — Other Ambulatory Visit: Payer: Self-pay | Admitting: Internal Medicine

## 2018-08-10 DIAGNOSIS — N6489 Other specified disorders of breast: Secondary | ICD-10-CM

## 2018-08-17 ENCOUNTER — Ambulatory Visit
Admission: RE | Admit: 2018-08-17 | Discharge: 2018-08-17 | Disposition: A | Discharge status: Home / Self Care | Payer: Medicare Other | Source: Ambulatory Visit | Attending: Internal Medicine | Admitting: Internal Medicine

## 2018-08-17 DIAGNOSIS — N6489 Other specified disorders of breast: Secondary | ICD-10-CM

## 2018-08-23 ENCOUNTER — Other Ambulatory Visit: Payer: Self-pay | Admitting: General Surgery

## 2018-08-23 DIAGNOSIS — C50111 Malignant neoplasm of central portion of right female breast: Secondary | ICD-10-CM

## 2018-08-23 DIAGNOSIS — Z170 Estrogen receptor positive status [ER+]: Principal | ICD-10-CM

## 2018-08-24 ENCOUNTER — Other Ambulatory Visit: Payer: Self-pay | Admitting: General Surgery

## 2018-08-24 DIAGNOSIS — Z170 Estrogen receptor positive status [ER+]: Principal | ICD-10-CM

## 2018-08-24 DIAGNOSIS — C50111 Malignant neoplasm of central portion of right female breast: Secondary | ICD-10-CM

## 2018-08-29 ENCOUNTER — Telehealth: Payer: Self-pay | Admitting: Hematology and Oncology

## 2018-08-29 DIAGNOSIS — D0511 Intraductal carcinoma in situ of right breast: Secondary | ICD-10-CM | POA: Insufficient documentation

## 2018-08-29 NOTE — Telephone Encounter (Signed)
A new patient breast appt has been scheduled for Ms. Ey to see Dr. Lindi Adie on 3/17 at 345pm. Pt has been cld and made of the appt date and time.

## 2018-08-29 NOTE — Progress Notes (Signed)
Location of Breast Cancer: Malignant neoplasm of right female breast, ER +  Did patient present with symptoms (if so, please note symptoms) or was this found on screening mammography?: Detected on screening mammogram  Screening mammogram: Small areas of distortion in the right breast up in the 12 o'clock position well above the areolar.  Histology per Pathology Report: Right Breast 08/17/2018  Receptor Status: ER(100%), PR (100%), Her2-neu (), Ki-()    Past/Anticipated interventions by surgeon, if any: Dr. Dalbert Batman 08/23/2018 -We discussed standard therapy with a lumpectomy, possible radiation therapy, possible antiestrogen therapy.  -Also discussed the active surveillance trial called COMET.  She declined the active surveillance study and wants to have cancer removed from her breast. -She will be scheduled for right breast lumpectomy with radioactive seed localization. -Because we're going to wait one month for surgery, she wanted to go ahead and be referred to medical oncology and radiation oncology here in [place]. -I told her that if she is offered radiation therapy to consider having that done in [place] for convenience and she will consider that. -Surgery scheduled for 09/22/2018  Past/Anticipated interventions by medical oncology, if any: Chemotherapy   Lymphedema issues, if any:  Had some swelling in her breast, none in her arm noted.  Pain issues, if any: Tenderness in breast.  BP (!) 145/63 (BP Location: Left Arm, Patient Position: Sitting)   Pulse 68   Temp 98.3 F (36.8 C) (Oral)   Resp 20   Wt 169 lb 9.6 oz (76.9 kg)   SpO2 100%   BMI 32.05 kg/m    Wt Readings from Last 3 Encounters:  08/30/18 169 lb 9.6 oz (76.9 kg)  07/01/16 151 lb 14.4 oz (68.9 kg)  07/31/15 167 lb (75.8 kg)    SAFETY ISSUES:  Prior radiation? No  Pacemaker/ICD? No  Possible current pregnancy? Tubal ligation  Is the patient on methotrexate? No  Current Complaints / other details:        Cori Razor, RN 08/29/2018,10:29 AM

## 2018-08-29 NOTE — Progress Notes (Signed)
Radiation Oncology         [phone] ________________________________  Name: Carol Wood        MRN: [id number]  Date of Service: 08/30/2018 DOB: 05/19/1946  PP:IRJJ, Costella Hatcher, MD  Fanny Skates, MD     REFERRING PHYSICIAN: Fanny Skates, MD   DIAGNOSIS: The encounter diagnosis was Ductal carcinoma in situ (DCIS) of right breast.   HISTORY OF PRESENT ILLNESS: Carol Wood is a 73 y.o. female seen for a new diagnosis of right breast cancer. The patient was noted to have screening detected abnormality in the right breast at 12:00. The details of the size of her tumor are not available at the time of dictation. She underwent stereo guided biopsy on 08/17/2018 revealing a intermediate grade DCIS with calcifications and fat necrosis.  Her tumor was ER PR positive.  She plans to proceed with lumpectomy of the right breast with Dr. Dalbert Batman on 09/22/2018, and will see Dr. Lindi Adie on 09/13/2018.  PREVIOUS RADIATION THERAPY: No   PAST MEDICAL HISTORY:  Past Medical History:  Diagnosis Date  . Cancer (HCC)    Skin  . Depressive disorder, not elsewhere classified   . Hypothyroidism   . Pure hypercholesterolemia   . Type II or unspecified type diabetes mellitus without mention of complication, not stated as uncontrolled   . Unspecified essential hypertension        PAST SURGICAL HISTORY: Past Surgical History:  Procedure Laterality Date  . BACK SURGERY    . COLONOSCOPY    . COLONOSCOPY N/A 07/31/2015   Procedure: COLONOSCOPY;  Surgeon: Rogene Houston, MD;  [facility];  Service: Endoscopy;  Laterality: N/A;  1200 - moved to 2/1 @ 10:30  . ESOPHAGEAL DILATION N/A 09/06/2014   Procedure: ESOPHAGEAL DILATION;  Surgeon: Rogene Houston, MD;  Location: [facility];  Service: Endoscopy;  Laterality: N/A;  . ESOPHAGOGASTRODUODENOSCOPY N/A 09/06/2014   Procedure: ESOPHAGOGASTRODUODENOSCOPY (EGD);  Surgeon: Rogene Houston, MD;  Location: [facility];  Service: Endoscopy;   Laterality: N/A;  220  . TUBAL LIGATION       FAMILY HISTORY:  Family History  Problem Relation Age of Onset  . Cancer Mother 28       Lung  . Colon cancer Brother   . Heart disease Son 16       CABG  . Heart disease Son 92       CABG     SOCIAL HISTORY:  reports that she has never smoked. She has never used smokeless tobacco. She reports that she does not drink alcohol or use drugs.   ALLERGIES: Patient has no known allergies.   MEDICATIONS:  Current Outpatient Medications  Medication Sig Dispense Refill  . acidophilus (RISAQUAD) CAPS capsule Take 1 capsule by mouth daily.    Marland Kitchen aspirin 325 MG tablet Take 325 mg by mouth daily.    Marland Kitchen b complex vitamins tablet Take 1 tablet by mouth daily.    . fish oil-omega-3 fatty acids 1000 MG capsule Take 2 g by mouth daily.     Marland Kitchen glipiZIDE (GLUCOTROL XL) 10 MG 24 hr tablet Take 10 mg by mouth 2 (two) times daily.    Marland Kitchen glucosamine-chondroitin 500-400 MG tablet Take 1 tablet by mouth 2 (two) times daily.    . insulin glargine (LANTUS) 100 UNIT/ML injection Inject 10 Units into the skin at bedtime. 10 units in evening    . levothyroxine (SYNTHROID, LEVOTHROID) 50 MCG tablet Take 75 mcg by mouth daily.     Marland Kitchen  lisinopril (PRINIVIL,ZESTRIL) 20 MG tablet Take 20 mg by mouth 2 (two) times daily.     . metFORMIN (GLUCOPHAGE) 500 MG tablet Take 500 mg by mouth 2 (two) times daily with a meal.     . Multiple Vitamins-Minerals (VITEYES AREDS FORMULA PO) Take 1 tablet by mouth 2 (two) times daily.    . pantoprazole (PROTONIX) 40 MG tablet Take 40 mg by mouth 2 (two) times daily before a meal.      No current facility-administered medications for this visit.      REVIEW OF SYSTEMS: On review of systems, the patient reports that she is doing well overall. She denies any chest pain, shortness of breath, cough, fevers, chills, night sweats, unintended weight changes. She denies any bowel or bladder disturbances, and denies abdominal pain, nausea or  vomiting. She denies any new musculoskeletal or joint aches or pains. A complete review of systems is obtained and is otherwise negative.     PHYSICAL EXAM:  Wt Readings from Last 3 Encounters:  07/01/16 151 lb 14.4 oz (68.9 kg)  07/31/15 167 lb (75.8 kg)  09/03/14 177 lb 12.8 oz (80.6 kg)   Temp Readings from Last 3 Encounters:  07/01/16 97.6 F (36.4 C)  07/31/15 97.6 F (36.4 C)  09/06/14 97.9 F (36.6 C) (Oral)   BP Readings from Last 3 Encounters:  07/01/16 112/80  07/31/15 (!) 100/53  09/06/14 (!) 145/62   Pulse Readings from Last 3 Encounters:  07/01/16 60  07/31/15 69  09/06/14 81     In general this is a well appearing caucasian female in no acute distress. She is alert and oriented x4 and appropriate throughout the examination. HEENT reveals that the patient is normocephalic, atraumatic. EOMs are intact. Skin is intact without any evidence of gross lesions. Written consent is obtained and placed in the chart, a copy was provided to the patient. Breast exam is deferred to her next visit postoperatively.  ECOG = 1  0 - Asymptomatic (Fully active, able to carry on all predisease activities without restriction)  1 - Symptomatic but completely ambulatory (Restricted in physically strenuous activity but ambulatory and able to carry out work of a light or sedentary nature. For example, light housework, office work)  2 - Symptomatic, <50% in bed during the day (Ambulatory and capable of all self care but unable to carry out any work activities. Up and about more than 50% of waking hours)  3 - Symptomatic, >50% in bed, but not bedbound (Capable of only limited self-care, confined to bed or chair 50% or more of waking hours)  4 - Bedbound (Completely disabled. Cannot carry on any self-care. Totally confined to bed or chair)  5 - Death   Eustace Pen MM, Creech RH, Tormey DC, et al. [phone]). "Toxicity and response criteria of the [facility]". [place]  Oncol. 5 (6): 649-55    LABORATORY DATA:  No results found for: WBC, HGB, HCT, MCV, PLT No results found for: NA, K, CL, CO2 No results found for: ALT, AST, GGT, ALKPHOS, BILITOT    RADIOGRAPHY: Mm Clip Placement Right  Result Date: 08/17/2018 CLINICAL DATA:  Status post stereotactic biopsy of the right breast. EXAM: DIAGNOSTIC RIGHT MAMMOGRAM POST STEREOTACTIC BIOPSY COMPARISON:  Previous exam(s). FINDINGS: Mammographic images were obtained following stereotactic guided biopsy of the right breast. Mammographic images show there is a coil shaped clip in the upper aspect of the right breast in appropriate position. IMPRESSION: Status post stereotactic biopsy of the  right breast with pathology pending. Final Assessment: Post Procedure Mammograms for Marker Placement Electronically Signed   By: Lillia Mountain M.D.   On: 08/17/2018 10:23   Mm Rt Breast Bx W Loc Dev 1st Lesion Image Bx Spec Stereo Guide  Addendum Date: 08/19/2018   ADDENDUM REPORT: 08/19/2018 13:11 ADDENDUM: Pathology of the RIGHT breast biopsy revealed DUCTAL CARCINOMA IN SITU WITH CALCIFICATIONS AND FOCAL NECROSIS. Microscopic Comment: The DCIS is intermediate grade. This was found to be concordant with Dr. Clarise Cruz impression and notes. Recommendation: Surgical referral. The patient contacted the [facility] on 08/18/2018 and spoke with Stacie Acres, RN, with concerns of significant swelling and possible small hematoma following the biopsy. At the time of conversation, she felt like the swelling was decreasing in size and did not wish to come in to the [facility] to have her breast checked. Post biopsy instructions were reviewed with the patient and all of her questions were answered. The biopsy results were not available at that time. At the patient's request, results and recommendations were relayed to the patient by Jetta Lout, [place] on 08/18/2018. Post biopsy instructions were again reviewed with the patient as  well as further discussion regarding the swelling. She stated she felt this was improving. She requested her surgical referral be made to [facility] in [place]. An appointment was made for 08/23/2018 at 3:15 PM with Dr. Dalbert Batman. The patient was notified of the appointment by the scheduler for [facility]. The patient was encouraged to contact the [facility] with any further questions or concerns. Reports and appointment information will be relayed to Dr. Marcial Pacas office by Peyton Bottoms, RT R,M., supervisor for the [facility]. Addendum by Jetta Lout, [place] on 08/19/2018. Electronically Signed   By: Lillia Mountain M.D.   On: 08/19/2018 13:11   Result Date: 08/19/2018 CLINICAL DATA:  Right breast distortion. EXAM: RIGHT BREAST STEREOTACTIC CORE NEEDLE BIOPSY COMPARISON:  Previous exams. FINDINGS: The patient and I discussed the procedure of stereotactic-guided biopsy including benefits and alternatives. We discussed the high likelihood of a successful procedure. We discussed the risks of the procedure including infection, bleeding, tissue injury, clip migration, and inadequate sampling. Informed written consent was given. The usual time out protocol was performed immediately prior to the procedure. Using sterile technique and 1% lidocaine and 1% lidocaine with epinephrine as local anesthetic, under stereotactic guidance, a 9 gauge vacuum assisted device was used to perform core needle biopsy of distortion in the 12 o'clock region of the right breast using a superior to inferior approach. Lesion quadrant: 12 o'clock At the conclusion of the procedure, a coil shaped tissue marker clip was deployed into the biopsy cavity. Follow-up 2-view mammogram was performed and dictated separately. IMPRESSION: Stereotactic-guided biopsy of the right breast. No apparent complications. Electronically Signed: By: Lillia Mountain M.D. On: 08/17/2018  10:22       IMPRESSION/PLAN: 1. ER/PR positive intermediate grade DCIS of the right breast. Dr. Lisbeth Renshaw discusses the pathology findings and reviews the nature of noninvasive breast disease. The consensus from the breast conference includes breast conservation with lumpectomy. She would be a candidate for external radiotherapy to the breast followed by antiestrogen therapy. Dr. Lisbeth Renshaw discusses the standard of care therapy as well as scenarios for which adjuvant treatment may be omitted. Given her performance status and goals of aggressive treatment, she's leaning toward proceeding with radiation. We discussed the risks, benefits, short, and long term  effects of radiotherapy, and the patient is interested in proceeding. Dr. Lisbeth Renshaw discusses the delivery and logistics of radiotherapy and anticipates a course of 4 or 6 1/2 weeks of radiotherapy, though we anticipate a 4 week course based on the current data. We will see her back about 2 weeks after surgery to discuss the simulation process and anticipate we starting radiotherapy about 4-6 weeks after surgery.    In a visit lasting 60 minutes, greater than 50% of the time was spent face to face discussing her case, and coordinating the patient's care.  The above documentation reflects my direct findings during this shared patient visit. Please see the separate note by Dr. Lisbeth Renshaw on this date for the remainder of the patient's plan of care.    Carola Rhine, PAC

## 2018-08-30 ENCOUNTER — Ambulatory Visit
Admission: RE | Admit: 2018-08-30 | Discharge: 2018-08-30 | Disposition: A | Discharge status: Home / Self Care | Payer: Medicare Other | Source: Ambulatory Visit | Attending: Radiation Oncology | Admitting: Radiation Oncology

## 2018-08-30 ENCOUNTER — Encounter: Payer: Self-pay | Admitting: Radiation Oncology

## 2018-08-30 ENCOUNTER — Other Ambulatory Visit: Payer: Self-pay

## 2018-08-30 VITALS — BP 145/63 | HR 68 | Temp 98.3°F | Resp 20 | Wt 169.6 lb

## 2018-08-30 DIAGNOSIS — E119 Type 2 diabetes mellitus without complications: Secondary | ICD-10-CM | POA: Diagnosis not present

## 2018-08-30 DIAGNOSIS — Z794 Long term (current) use of insulin: Secondary | ICD-10-CM | POA: Insufficient documentation

## 2018-08-30 DIAGNOSIS — Z85828 Personal history of other malignant neoplasm of skin: Secondary | ICD-10-CM | POA: Insufficient documentation

## 2018-08-30 DIAGNOSIS — C50811 Malignant neoplasm of overlapping sites of right female breast: Secondary | ICD-10-CM

## 2018-08-30 DIAGNOSIS — Z79899 Other long term (current) drug therapy: Secondary | ICD-10-CM | POA: Insufficient documentation

## 2018-08-30 DIAGNOSIS — D0511 Intraductal carcinoma in situ of right breast: Secondary | ICD-10-CM | POA: Diagnosis present

## 2018-08-30 DIAGNOSIS — I10 Essential (primary) hypertension: Secondary | ICD-10-CM | POA: Diagnosis not present

## 2018-08-30 DIAGNOSIS — Z7982 Long term (current) use of aspirin: Secondary | ICD-10-CM | POA: Insufficient documentation

## 2018-08-30 DIAGNOSIS — Z800 Family history of malignant neoplasm of digestive organs: Secondary | ICD-10-CM | POA: Insufficient documentation

## 2018-08-30 DIAGNOSIS — Z170 Estrogen receptor positive status [ER+]: Principal | ICD-10-CM

## 2018-08-30 DIAGNOSIS — E039 Hypothyroidism, unspecified: Secondary | ICD-10-CM | POA: Diagnosis not present

## 2018-08-30 DIAGNOSIS — E7800 Pure hypercholesterolemia, unspecified: Secondary | ICD-10-CM | POA: Insufficient documentation

## 2018-08-30 DIAGNOSIS — F329 Major depressive disorder, single episode, unspecified: Secondary | ICD-10-CM | POA: Diagnosis not present

## 2018-09-08 ENCOUNTER — Ambulatory Visit: Payer: Medicare Other | Admitting: Hematology and Oncology

## 2018-09-13 ENCOUNTER — Other Ambulatory Visit: Payer: Self-pay

## 2018-09-13 ENCOUNTER — Inpatient Hospital Stay: Payer: Medicare Other | Attending: Hematology and Oncology | Admitting: Hematology and Oncology

## 2018-09-13 DIAGNOSIS — D0511 Intraductal carcinoma in situ of right breast: Secondary | ICD-10-CM

## 2018-09-13 NOTE — Pre-Procedure Instructions (Signed)
Meigan Pates  09/13/2018      [place], [place] [facility] [address] [place] [address] Phone: [phone] Fax: [place], [place] [address] [place] [address] Phone: [phone] Fax: [phone]    Your procedure is scheduled on March 26th, 2020.  Report to [facility] Admitting at 05:30 A.M.  Call this number if you have problems the morning of surgery:  [phone]   Remember:  Do not eat or drink after midnight. This includes gum and hard candy    Take these medicines the morning of surgery with A SIP OF WATER: Synthroid, Metoprolol, and Protonix. Also the night before surgery, only take 7  UNITS of lantus before bed. Check blood sugar in the AM, and if it is below 70, treat with a half a cup of clear juice, either apple or cranberry.  Recheck blood sugar 15 minutes after drinking juice.    Do not wear jewelry, make-up or nail polish.  Do not wear lotions, powders, or perfumes, or deodorant.  Do not shave 48 hours prior to surgery.  Men may shave face and neck.  Do not bring valuables to the hospital.  [facility] is not responsible for any belongings or valuables.  Contacts, dentures or bridgework may not be worn into surgery.  Leave your suitcase in the car.  After surgery it may be brought to your room.  For patients admitted to the hospital, discharge time will be determined by your treatment team.  Patients discharged the day of surgery will not be allowed to drive home.    [facility]- Preparing For [facility]  Before surgery, you can play an important role. Because skin is not sterile, your skin needs to be as free of germs as possible. You can reduce the number of germs on your skin by washing with CHG (chlorahexidine gluconate) Soap before surgery.  CHG is an antiseptic cleaner which kills germs and bonds with the skin to continue killing  germs even after washing.    Oral Hygiene is also important to reduce your risk of infection.  Remember - BRUSH YOUR TEETH THE MORNING OF SURGERY WITH YOUR REGULAR TOOTHPASTE  Please do not use if you have an allergy to CHG or antibacterial soaps. If your skin becomes reddened/irritated stop using the CHG.  Do not shave (including legs and underarms) for at least 48 hours prior to first CHG shower. It is OK to shave your face.  Please follow these instructions carefully.   1. Shower the NIGHT BEFORE SURGERY and the MORNING OF SURGERY with CHG.   2. If you chose to wash your hair, wash your hair first as usual with your normal shampoo.  3. After you shampoo, rinse your hair and body thoroughly to remove the shampoo.  4. Use CHG as you would any other liquid soap. You can apply CHG directly to the skin and wash gently with a scrungie or a clean washcloth.   5. Apply the CHG Soap to your body ONLY FROM THE NECK DOWN.  Do not use on open wounds or open sores. Avoid contact with your eyes, ears, mouth and genitals (private parts). Wash Face and genitals (private parts)  with your normal soap.  6. Wash thoroughly, paying special attention to the area where your surgery will be performed.  7. Thoroughly rinse your body with warm water  from the neck down.  8. DO NOT shower/wash with your normal soap after using and rinsing off the CHG Soap.  9. Pat yourself dry with a CLEAN TOWEL.  10. Wear CLEAN PAJAMAS to bed the night before surgery, wear comfortable clothes the morning of surgery  11. Place CLEAN SHEETS on your bed the night of your first shower and DO NOT SLEEP WITH PETS.    Day of Surgery:  Do not apply any deodorants/lotions.  Please wear clean clothes to the hospital/surgery center.   Remember to brush your teeth WITH YOUR REGULAR TOOTHPASTE.

## 2018-09-13 NOTE — Progress Notes (Signed)
[facility] CONSULT NOTE  Patient Care Team: Glenda Chroman, MD as PCP - General (Internal Medicine) Jacelyn Pi, MD as Attending Physician (Endocrinology)   CHIEF COMPLAINTS/PURPOSE OF CONSULTATION: Newly diagnosed breast cancer  HISTORY OF PRESENTING ILLNESS:  Carol Wood 73 y.o. female is here because of recent diagnosis of DCIS of the right breast. The cancer was detected on a routine screening mammogram and was not palpable prior to diagnosis. A biopsy on 08/17/18 showed it to be grade 1 DCIS, ER 100%, PR 100%. She has already consulted with radiation oncology and a lumpectomy is scheduled with Dr. Dalbert Batman on 09/22/18. She presents to the clinic today with her sister. She denies any family history of breast cancer. She had a large hematoma following the biopsy that was significantly painful but has improved. She is not interested in participating in a clinical trial. 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   I reviewed her records extensively and collaborated the history with the patient.  SUMMARY OF ONCOLOGIC HISTORY:   Ductal carcinoma in situ (DCIS) of right breast   08/17/2018 Initial Diagnosis    Screening mammogram [facility] detected distortion in the right breast 12 o'clock position.  Biopsy revealed intermediate grade DCIS with calcifications, ER 100%, PR 100%, Tis NX stage 0     MEDICAL HISTORY:  Past Medical History:  Diagnosis Date   Cancer ([place])    Skin   Depressive disorder, not elsewhere classified    Hypothyroidism    Pure hypercholesterolemia    Type II or unspecified type diabetes mellitus without mention of complication, not stated as uncontrolled    Unspecified essential hypertension     SURGICAL HISTORY: Past Surgical History:  Procedure Laterality Date   BACK SURGERY     COLONOSCOPY     COLONOSCOPY N/A 07/31/2015   Procedure: COLONOSCOPY;  Surgeon: Rogene Houston, MD;  Location: [facility]  ENDO [facility];  Service: Endoscopy;  Laterality: N/A;  1200 - moved to 2/1 @ 10:30   ESOPHAGEAL DILATION N/A 09/06/2014   Procedure: ESOPHAGEAL DILATION;  Surgeon: Rogene Houston, MD;  Location: [facility] ENDO SUITE;  Service: Endoscopy;  Laterality: N/A;   ESOPHAGOGASTRODUODENOSCOPY N/A 09/06/2014   Procedure: ESOPHAGOGASTRODUODENOSCOPY (EGD);  Surgeon: Rogene Houston, MD;  Location: [facility];  Service: Endoscopy;  Laterality: N/A;  220   TUBAL LIGATION      SOCIAL HISTORY: Social History   Socioeconomic History   Marital status: Widowed    Spouse name: Not on file   Number of children: 3   Years of education: Not on file   Highest education level: Not on file  Occupational History   Not on file  Social Needs   Financial resource strain: Not on file   Food insecurity:    Worry: Not on file    Inability: Not on file   Transportation needs:    Medical: No    Non-medical: No  Tobacco Use   Smoking status: Never Smoker   Smokeless tobacco: Never Used  Substance and Sexual Activity   Alcohol use: No    Alcohol/week: 0.0 standard drinks   Drug use: No   Sexual activity: Not on file  Lifestyle   Physical activity:    Days per week: Not on file    Minutes per session: Not on file   Stress: Not on file  Relationships   Social connections:    Talks on phone: Not on file  Gets together: Not on file    Attends religious service: Not on file    Active member of club or organization: Not on file    Attends meetings of clubs or organizations: Not on file    Relationship status: Not on file   Intimate partner violence:    Fear of current or ex partner: Not on file    Emotionally abused: Not on file    Physically abused: Not on file    Forced sexual activity: Not on file  Other Topics Concern   Not on file  Social History Narrative   Lives alone.  Widow.  Retired Marine scientist.    FAMILY HISTORY: Family History  Problem Relation Age of Onset   Cancer Mother 29        Lung   Colon cancer Brother    Heart disease Son 102       CABG   Heart disease Son 20       CABG    ALLERGIES:  has No Known Allergies.  MEDICATIONS:  Current Outpatient Medications  Medication Sig Dispense Refill   ACCU-CHEK AVIVA PLUS test strip      ACCU-CHEK SOFTCLIX LANCETS lancets      aspirin 325 MG tablet Take 325 mg by mouth daily.     b complex vitamins tablet Take 1 tablet by mouth daily.     Cholecalciferol (VITAMIN D) 50 MCG (2000 UT) CAPS Take 2,000 Units by mouth 2 (two) times daily.     Flaxseed, Linseed, (FLAXSEED OIL) 1000 MG CAPS Take 1,000 mg by mouth daily.     glipiZIDE (GLUCOTROL XL) 10 MG 24 hr tablet Take 10 mg by mouth 2 (two) times daily.     glucosamine-chondroitin 500-400 MG tablet Take 1 tablet by mouth 2 (two) times daily.     insulin glargine (LANTUS) 100 UNIT/ML injection Inject 15 Units into the skin at bedtime.      levothyroxine (SYNTHROID, LEVOTHROID) 75 MCG tablet Take 75 mcg by mouth daily before breakfast.      lisinopril (PRINIVIL,ZESTRIL) 20 MG tablet Take 20 mg by mouth 2 (two) times daily.      Magnesium 250 MG TABS Take 250 mg by mouth daily.     metoprolol succinate (TOPROL-XL) 25 MG 24 hr tablet Take 25 mg by mouth daily.     Multiple Vitamin (MULTIVITAMIN WITH MINERALS) TABS tablet Take 1 tablet by mouth daily.     Multiple Vitamins-Minerals (VITEYES AREDS FORMULA PO) Take 1 tablet by mouth 2 (two) times daily.     pantoprazole (PROTONIX) 40 MG tablet Take 40 mg by mouth daily.      vitamin C (ASCORBIC ACID) 500 MG tablet Take 500 mg by mouth daily.     No current facility-administered medications for this visit.     REVIEW OF SYSTEMS:   Constitutional: Denies fevers, chills or abnormal night sweats Eyes: Denies blurriness of vision, double vision or watery eyes Ears, nose, mouth, throat, and face: Denies mucositis or sore throat Respiratory: Denies cough, dyspnea or wheezes Cardiovascular: Denies  palpitation, chest discomfort or lower extremity swelling Gastrointestinal:  Denies nausea, heartburn or change in bowel habits Skin: Denies abnormal skin rashes Lymphatics: Denies new lymphadenopathy or easy bruising Neurological:Denies numbness, tingling or new weaknesses Behavioral/Psych: Mood is stable, no new changes  Breast: Denies any palpable lumps or discharge All other systems were reviewed with the patient and are negative.  PHYSICAL EXAMINATION: ECOG PERFORMANCE STATUS: 1 - Symptomatic but completely ambulatory  Vitals:  09/13/18 1440  BP: (!) 121/56  Pulse: 67  Resp: 18  Temp: 98.3 F (36.8 C)  SpO2: 99%   Filed Weights   09/13/18 1440  Weight: 174 lb 1.6 oz (79 kg)    GENERAL:alert, no distress and comfortable SKIN: skin color, texture, turgor are normal, no rashes or significant lesions EYES: normal, conjunctiva are pink and non-injected, sclera clear OROPHARYNX:no exudate, no erythema and lips, buccal mucosa, and tongue normal  NECK: supple, thyroid normal size, non-tender, without nodularity LYMPH:  no palpable lymphadenopathy in the cervical, axillary or inguinal LUNGS: clear to auscultation and percussion with normal breathing effort HEART: regular rate & rhythm and no murmurs and no lower extremity edema ABDOMEN:abdomen soft, non-tender and normal bowel sounds Musculoskeletal:no cyanosis of digits and no clubbing  PSYCH: alert & oriented x 3 with fluent speech NEURO: no focal motor/sensory deficits  LABORATORY DATA:  I have reviewed the data as listed No results found for: WBC, HGB, HCT, MCV, PLT No results found for: NA, K, CL, CO2  RADIOGRAPHIC STUDIES: I have personally reviewed the radiological reports and agreed with the findings in the report.  ASSESSMENT AND PLAN:  Ductal carcinoma in situ (DCIS) of right breast 08/17/2018:Screening mammogram [facility] detected distortion in the right breast 12 o'clock position.  Biopsy revealed  intermediate grade DCIS with calcifications, ER 100%, PR 100%, Tis NX stage 0  Pathology review: I discussed with the patient the difference between DCIS and invasive breast cancer. It is considered a precancerous lesion. DCIS is classified as a 0. It is generally detected through mammograms as calcifications. We discussed the significance of grades and its impact on prognosis. We also discussed the importance of ER and PR receptors and their implications to adjuvant treatment options. Prognosis of DCIS dependence on grade, comedo necrosis. It is anticipated that if not treated, 20-30% of DCIS can develop into invasive breast cancer.  Recommendation: 1. Breast conserving surgery 2. Followed by adjuvant radiation therapy 3. Followed by antiestrogen therapy with tamoxifen 5 years  Tamoxifen counseling: We discussed the risks and benefits of tamoxifen. These include but not limited to insomnia, hot flashes, mood changes, vaginal dryness, and weight gain. Although rare, serious side effects including endometrial cancer, risk of blood clots were also discussed. We strongly believe that the benefits far outweigh the risks. Patient understands these risks and consented to starting treatment. Planned treatment duration is 5 years.  Return to clinic after surgery to discuss the final pathology report and come up with an adjuvant treatment plan.  We will discussed the pros and cons of antiestrogen therapy and she will make a decision.     All questions were answered. The patient knows to call the clinic with any problems, questions or concerns.   Nicholas Lose, MD 09/13/2018   I, Cloyde Reams Dorshimer, am acting as scribe for Nicholas Lose, MD.  I have reviewed the above documentation for accuracy and completeness, and I agree with the above.

## 2018-09-13 NOTE — Assessment & Plan Note (Signed)
08/17/2018:Screening mammogram [facility] detected distortion in the right breast 12 o'clock position.  Biopsy revealed intermediate grade DCIS with calcifications, ER 100%, PR 100%, Tis NX stage 0  Pathology review: I discussed with the patient the difference between DCIS and invasive breast cancer. It is considered a precancerous lesion. DCIS is classified as a 0. It is generally detected through mammograms as calcifications. We discussed the significance of grades and its impact on prognosis. We also discussed the importance of ER and PR receptors and their implications to adjuvant treatment options. Prognosis of DCIS dependence on grade, comedo necrosis. It is anticipated that if not treated, 20-30% of DCIS can develop into invasive breast cancer.  Recommendation: 1. Breast conserving surgery 2. Followed by adjuvant radiation therapy 3. Followed by antiestrogen therapy with tamoxifen 5 years  Tamoxifen counseling: We discussed the risks and benefits of tamoxifen. These include but not limited to insomnia, hot flashes, mood changes, vaginal dryness, and weight gain. Although rare, serious side effects including endometrial cancer, risk of blood clots were also discussed. We strongly believe that the benefits far outweigh the risks. Patient understands these risks and consented to starting treatment. Planned treatment duration is 5 years.  Return to clinic after surgery to discuss the final pathology report and come up with an adjuvant treatment plan.

## 2018-09-14 ENCOUNTER — Encounter (HOSPITAL_COMMUNITY): Payer: Self-pay

## 2018-09-14 ENCOUNTER — Encounter (HOSPITAL_COMMUNITY)
Admission: RE | Admit: 2018-09-14 | Discharge: 2018-09-14 | Disposition: A | Discharge status: Home / Self Care | Payer: Medicare Other | Source: Ambulatory Visit | Attending: General Surgery | Admitting: General Surgery

## 2018-09-14 ENCOUNTER — Encounter: Payer: Self-pay | Admitting: *Deleted

## 2018-09-14 ENCOUNTER — Other Ambulatory Visit: Payer: Self-pay

## 2018-09-14 DIAGNOSIS — Z01818 Encounter for other preprocedural examination: Secondary | ICD-10-CM | POA: Diagnosis present

## 2018-09-14 HISTORY — DX: Transient cerebral ischemic attack, unspecified: G45.9

## 2018-09-14 HISTORY — DX: Anemia, unspecified: D64.9

## 2018-09-14 HISTORY — DX: Personal history of other diseases of the digestive system: Z87.19

## 2018-09-14 HISTORY — DX: Pneumonia, unspecified organism: J18.9

## 2018-09-14 LAB — CBC WITH DIFFERENTIAL/PLATELET
Abs Immature Granulocytes: 0.02 10*3/uL (ref 0.00–0.07)
Basophils Absolute: 0.1 10*3/uL (ref 0.0–0.1)
Basophils Relative: 1 %
Eosinophils Absolute: 0.3 10*3/uL (ref 0.0–0.5)
Eosinophils Relative: 4 %
HCT: 47.1 % — ABNORMAL HIGH (ref 36.0–46.0)
Hemoglobin: 14.4 g/dL (ref 12.0–15.0)
IMMATURE GRANULOCYTES: 0 %
Lymphocytes Relative: 35 %
Lymphs Abs: 2.8 10*3/uL (ref 0.7–4.0)
MCH: 28.9 pg (ref 26.0–34.0)
MCHC: 30.6 g/dL (ref 30.0–36.0)
MCV: 94.6 fL (ref 80.0–100.0)
Monocytes Absolute: 0.6 10*3/uL (ref 0.1–1.0)
Monocytes Relative: 7 %
Neutro Abs: 4.2 10*3/uL (ref 1.7–7.7)
Neutrophils Relative %: 53 %
Platelets: 364 10*3/uL (ref 150–400)
RBC: 4.98 MIL/uL (ref 3.87–5.11)
RDW: 12.8 % (ref 11.5–15.5)
WBC: 7.9 10*3/uL (ref 4.0–10.5)
nRBC: 0 % (ref 0.0–0.2)

## 2018-09-14 LAB — COMPREHENSIVE METABOLIC PANEL
ALBUMIN: 4 g/dL (ref 3.5–5.0)
ALT: 28 U/L (ref 0–44)
AST: 26 U/L (ref 15–41)
Alkaline Phosphatase: 79 U/L (ref 38–126)
Anion gap: 10 (ref 5–15)
BUN: 14 mg/dL (ref 8–23)
CHLORIDE: 106 mmol/L (ref 98–111)
CO2: 23 mmol/L (ref 22–32)
Calcium: 9.8 mg/dL (ref 8.9–10.3)
Creatinine, Ser: 0.86 mg/dL (ref 0.44–1.00)
GFR calc Af Amer: >60 mL/min (ref >60)
GFR calc non Af Amer: >60 mL/min (ref >60)
Glucose, Bld: 179 mg/dL — ABNORMAL HIGH (ref 70–99)
Potassium: 4.6 mmol/L (ref 3.5–5.1)
SODIUM: 139 mmol/L (ref 135–145)
Total Bilirubin: 0.4 mg/dL (ref 0.3–1.2)
Total Protein: 7.5 g/dL (ref 6.5–8.1)

## 2018-09-14 LAB — GLUCOSE, CAPILLARY: Glucose-Capillary: 159 mg/dL — ABNORMAL HIGH (ref 70–99)

## 2018-09-14 LAB — HEMOGLOBIN A1C
Hgb A1c MFr Bld: 6.9 % — ABNORMAL HIGH (ref 4.8–5.6)
Mean Plasma Glucose: 151.33 mg/dL

## 2018-09-14 NOTE — Progress Notes (Addendum)
Carol Wood            09/14/2018                          [name], [place] [facility] [address] [place] [address] Phone: [phone] Fax: [place], [place] [address] [place] [address] Phone: [phone] Fax: [phone]              Your procedure is scheduled on March 26th, 2020.            Report to [facility] Admitting at 05:30 A.M.            Call this number if you have problems the morning of surgery:            [phone]             Remember:            Do not eat after midnight on March 25th You may drink clear liquids until 3 hours (4:30AM) prior to surgery. Theses are allowed: Water, Tea, Black Coffee, Juice (no pulp), Carbonated beverages, and Gatorade. No dairy products.                        Take these medicines the morning of surgery with A SIP OF WATER:  Synthroid, Metoprolol, and Protonix.   Follow your surgeon's instructions on when to stop Aspirin.  If no instructions were given by your surgeon then you will need to call the office to get those instructions.    7 days before surgery (09/15/18), stop taking all Other Aspirin Products, Vitamins, Fish oils, and Herbal medications. Also stop all NSAIDS i.e. Advil, Ibuprofen, Motrin, Aleve, Anaprox, Naproxen, BC, Goody Powders, and all Supplements.  . THE NIGHT BEFORE SURGERY, take ____7_______ units of ____Lantus_______insulin  . Do not take GlipiZIDE (GLUCOTROL XL) the evening before or the morning of surgery.   How to Manage Your Diabetes Before and After Surgery  Why is it important to control my blood sugar before and after surgery? . Improving blood sugar levels before and after surgery helps healing and can limit problems. . A way of improving blood sugar control is eating a healthy diet by: o  Eating less sugar and carbohydrates o  Increasing  activity/exercise o  Talking with your doctor about reaching your blood sugar goals . High blood sugars (greater than 180 mg/dL) can raise your risk of infections and slow your recovery, so you will need to focus on controlling your diabetes during the weeks before surgery. . Make sure that the doctor who takes care of your diabetes knows about your planned surgery including the date and location.  How do I manage my blood sugar before surgery? . Check your blood sugar at least 4 times a day, starting 2 days before surgery, to make sure that the level is not too high or low. o Check your blood sugar the morning of your surgery when you wake up and every 2 hours until you get to the Short Stay unit. . If your blood sugar is less than 70 mg/dL, you will need to treat for low blood sugar: o Do not take insulin. o Treat a low blood sugar (less than 70 mg/dL) with  cup of clear juice (cranberry or apple), 4  glucose tablets, OR glucose gel. Recheck blood sugar in 15 minutes after treatment (to make sure it is greater than 70 mg/dL). If your blood sugar is not greater than 70 mg/dL on recheck, call [phone] o  for further instructions. . Report your blood sugar to the short stay nurse when you get to Short Stay.  . If you are admitted to the hospital after surgery: o Your blood sugar will be checked by the staff and you will probably be given insulin after surgery (instead of oral diabetes medicines) to make sure you have good blood sugar levels. o The goal for blood sugar control after surgery is 80-180 mg/dL.      Reviewed and Endorsed by [facility] Patient Education Committee, August 2015              Do not wear jewelry, make-up or nail polish.            Do not wear lotions, powders, or perfumes, or deodorant.            Do not shave 48 hours prior to surgery.              Do not bring valuables to the hospital.            [facility] is not responsible for any belongings or  valuables.  Contacts, dentures or bridgework may not be worn into surgery.  Leave your suitcase in the car.  After surgery it may be brought to your room.  For patients admitted to the hospital, discharge time will be determined by your treatment team.  Patients discharged the day of surgery will not be allowed to drive home.   - Preparing For Surgery  Before surgery, you can play an important role. Because skin is not sterile, your skin needs to be as free of germs as possible. You can reduce the number of germs on your skin by washing with CHG (chlorahexidine gluconate) Soap before surgery.  CHG is an antiseptic cleaner which kills germs and bonds with the skin to continue killing germs even after washing.    Oral Hygiene is also important to reduce your risk of infection.  Remember - BRUSH YOUR TEETH THE MORNING OF SURGERY WITH YOUR REGULAR TOOTHPASTE  Please do not use if you have an allergy to CHG or antibacterial soaps. If your skin becomes reddened/irritated stop using the CHG.  Do not shave (including legs and underarms) for at least 48 hours prior to first CHG shower. It is OK to shave your face.  Please follow these instructions carefully.                                                                                                                     1. Shower the NIGHT BEFORE SURGERY and the MORNING OF SURGERY with CHG.   2. If you chose to wash your hair, wash your hair first as usual with your normal shampoo.  3. After you shampoo, rinse your  hair and body thoroughly to remove the shampoo.  4. Use CHG as you would any other liquid soap. You can apply CHG directly to the skin and wash gently with a scrungie or a clean washcloth.   5. Apply the CHG Soap to your body ONLY FROM THE NECK DOWN.  Do not use on open wounds or open sores. Avoid contact with your eyes, ears, mouth and genitals (private parts). Wash Face and genitals (private parts)  with your  normal soap.  6. Wash thoroughly, paying special attention to the area where your surgery will be performed.  7. Thoroughly rinse your body with warm water from the neck down.  8. DO NOT shower/wash with your normal soap after using and rinsing off the CHG Soap.  9. Pat yourself dry with a CLEAN TOWEL.  10. Wear CLEAN PAJAMAS to bed the night before surgery, wear comfortable clothes the morning of surgery  11. Place CLEAN SHEETS on your bed the night of your first shower and DO NOT SLEEP WITH PETS.  Day of Surgery:  Do not apply any deodorants/lotions.  Please wear clean clothes to the hospital/surgery center.   Remember to brush your teeth WITH YOUR REGULAR TOOTHPASTE.

## 2018-09-14 NOTE — Progress Notes (Signed)
PCP - Dr. Woody Seller  Cardiologist - Denies  Chest x-ray - Denies  EKG - 09/14/2018  Stress Test -09/14/11 (E)- Req'd recent study   ECHO - 04/13/97 (E)  Cardiac Cath - Denies  AICD-na PM-na LOOP-na  Sleep Study - Denies CPAP - None  LABS- 09/14/2018: CBC w/D, CMP  ASA- LD- 3/17  HA1C- 09/14/2018 Fasting Blood Sugar - 30-220, Today, 159 Checks Blood Sugar ___1__ times a day  Anesthesia- Yes- req'd records  Pt denies having chest pain, sob, or fever at this time. All instructions explained to the pt, with a verbal understanding of the material. Pt agrees to go over the instructions while at home for a better understanding. The opportunity to ask questions was provided.

## 2018-09-14 NOTE — Progress Notes (Signed)

## 2018-09-16 NOTE — Anesthesia Preprocedure Evaluation (Addendum)
Anesthesia Evaluation  Patient identified by MRN, date of birth, ID band Patient awake    Reviewed: Allergy & Precautions, NPO status , Patient's Chart, lab work & pertinent test results, reviewed documented beta blocker date and time   Airway Mallampati: III  TM Distance: >3 FB Neck ROM: Full    Dental no notable dental hx.    Pulmonary neg pulmonary ROS,    Pulmonary exam normal breath sounds clear to auscultation       Cardiovascular hypertension, Pt. on medications and Pt. on home beta blockers Normal cardiovascular exam Rhythm:Regular Rate:Normal  ECG: rate 69, NSR   Neuro/Psych PSYCHIATRIC DISORDERS Depression TIA   GI/Hepatic Neg liver ROS, hiatal hernia,   Endo/Other  diabetes, Oral Hypoglycemic Agents, Insulin DependentHypothyroidism   Renal/GU negative Renal ROS     Musculoskeletal negative musculoskeletal ROS (+)   Abdominal (+) + obese,   Peds  Hematology HLD   Anesthesia Other Findings DUCTAL CARCINOMA RIGHT BREAST  Reproductive/Obstetrics                           Anesthesia Physical Anesthesia Plan  ASA: III  Anesthesia Plan: General   Post-op Pain Management:    Induction: Intravenous  PONV Risk Score and Plan: 3 and Ondansetron, Dexamethasone and Treatment may vary due to age or medical condition  Airway Management Planned: LMA  Additional Equipment:   Intra-op Plan:   Post-operative Plan: Extubation in OR  Informed Consent: I have reviewed the patients History and Physical, chart, labs and discussed the procedure including the risks, benefits and alternatives for the proposed anesthesia with the patient or authorized representative who has indicated his/her understanding and acceptance.     Dental advisory given  Plan Discussed with: CRNA  Anesthesia Plan Comments: (Per PA: Patient had nuclear stress 01/29/2017 at [facility] ordered by her PCP.  Study  showed no reversible ischemia or infarction.  Normal left ventricular wall motion.  Left ventricular ejection fraction 81%.  Noninvasive risk stratification: Low.  Copy of results on patient chart.)      Anesthesia Quick Evaluation

## 2018-09-18 NOTE — H&P (Signed)
Karin Lieu  Location: [facility] Patient #: [id number] DOB: July 28, 1945 Widowed / Language: Cleophus Molt / Race: White Female       History of Present Illness  The patient is a 73 year old female who presents with breast cancer. This is a very pleasant 73 year old female. Referred by Dr. Woody Seller, her PCP and Dr. RCA with the BCG for DCIS right breast. Her son and daughter-in-law are with her today throughout the Encounter.  She has no prior history of breast problems. Annual screening mammograms recently demonstrated a new finding. Small area of distortion in the right breast up in the 12 o'clock position well above the areola. Mammograms at [facility] show that the left breast is normal. Category B density. 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 She takes aspirin  Past history significant for insulin-dependent diabetes, hypertension, hypothyroidism, GERD, TIA remotely, lumbar laminectomy, tubal ligation. Takes aspirin Family history negative for breast or ovarian cancer Social history lives alone. Has been a widow for 20 years. 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 3 children, all sons. Denies tobacco. Drinks alcohol rarely  We discussed standard therapy with a lumpectomy, possible radiation therapy, possible antiestrogen therapy. Also discussed the active surveillance trial Called COMET. She declined the active surveillance study and wants to have cancer removed from her breast. That is reasonable She will be scheduled for right breast lumpectomy with radioactive seed localization Because of the hematoma and the bruising were going to wait until late March for that Also she wants to wait because a grandchild is going to get heart surgery at [facility] on March 24. We  will schedule her surgery after March 25  I discussed the indications, details, techniques, numerous risk of the surgery with her. She is aware of the risk of bleeding, infection, reoperation for positive margins or upgrade to invasive cancer, cosmetic deformity, chronic pain. She understands all of these issues. All of her questions are answered. She agrees with this plan  Because were going to wait one month for surgery, she wanted to go ahead and be referred to medical oncology and radiation oncology here in [place] She states that she prefers most of her care be given here in [place] I told her that if she is offered radiation therapy to consider having that done in [place] for convenience and she will consider that I told her to stop her aspirin 5 days preop   Past Surgical History  Breast Biopsy  Right. Cataract Surgery  Bilateral. Colon Polyp Removal - Colonoscopy  Spinal Surgery - Lower Back   Diagnostic Studies History  Colonoscopy  1-5 years ago Mammogram  within last year Pap Smear  >5 years ago  Allergies  No Known Drug Allergies  Allergies Reconciled   Medication History Aspirin (325MG  Tablet, Oral) Active. glipiZIDE ER (10MG  Tablet ER 24HR, Oral) Active. Levothyroxine Sodium (75MCG Tablet, Oral) Active. Lisinopril (20MG  Tablet, Oral) Active. Pantoprazole Sodium (40MG  Tablet DR, Oral) Active. Metoprolol Tartrate (Oral) Specific strength unknown - Active. Medications Reconciled  Social History  Alcohol use  Remotely quit alcohol use. Caffeine use  Coffee. No drug use  Tobacco use  Never smoker.  Family History  Alcohol Abuse  Brother, Father, Son. Cancer  Father, Mother. Cerebrovascular Accident  Son. Colon Cancer  Brother. Depression  Brother, Sister, Son. Diabetes Mellitus  Brother, Father, Mother,  Sister. Heart Disease  Brother, Son. Heart disease in female family member before age 47  Hypertension  Brother,  Son. Kidney Disease  Brother. Migraine Headache  Sister. Rectal Cancer  Brother. Respiratory Condition  Father, Mother. Thyroid problems  Brother, Sister.  Pregnancy / Birth History  Age at menarche  56 years. Age of menopause  51-55 Contraceptive History  Intrauterine device, Oral contraceptives. Gravida  3 Maternal age  81-20 Para  3 Regular periods   Other Problems Anxiety Disorder  Arthritis  Back Pain  Breast Cancer  Cerebrovascular Accident  Depression  Diabetes Mellitus  Gastroesophageal Reflux Disease  Hemorrhoids  High blood pressure  Hypercholesterolemia  Lump In Breast  Migraine Headache  Thyroid Disease     Review of Systems  General Present- Fatigue. Not Present- Appetite Loss, Chills, Fever, Night Sweats, Weight Gain and Weight Loss. Skin Not Present- Change in Wart/Mole, Dryness, Hives, Jaundice, New Lesions, Non-Healing Wounds, Rash and Ulcer. HEENT Present- Hearing Loss, Seasonal Allergies and Wears glasses/contact lenses. Not Present- Earache, Hoarseness, Nose Bleed, Oral Ulcers, Ringing in the Ears, Sinus Pain, Sore Throat, Visual Disturbances and Yellow Eyes. Respiratory Not Present- Bloody sputum, Chronic Cough, Difficulty Breathing, Snoring and Wheezing. Breast Present- Breast Mass and Breast Pain. Not Present- Nipple Discharge and Skin Changes. Cardiovascular Not Present- Chest Pain, Difficulty Breathing Lying Down, Leg Cramps, Palpitations, Rapid Heart Rate, Shortness of Breath and Swelling of Extremities. Gastrointestinal Present- Hemorrhoids. Not Present- Abdominal Pain, Bloating, Bloody Stool, Change in Bowel Habits, Chronic diarrhea, Constipation, Difficulty Swallowing, Excessive gas, Gets full quickly at meals, Indigestion, Nausea, Rectal Pain and Vomiting. Female Genitourinary Not Present- Frequency, Nocturia, Painful Urination, Pelvic Pain and Urgency. Musculoskeletal Present- Back Pain and Joint Pain. Not Present-  Joint Stiffness, Muscle Pain, Muscle Weakness and Swelling of Extremities. Neurological Present- Decreased Memory and Weakness. Not Present- Fainting, Headaches, Numbness, Seizures, Tingling, Tremor and Trouble walking. Endocrine Present- Cold Intolerance. Not Present- Excessive Hunger, Hair Changes, Heat Intolerance, Hot flashes and New Diabetes. Hematology Present- Easy Bruising. Not Present- Blood Thinners, Excessive bleeding, Gland problems, HIV and Persistent Infections.  Vitals  Weight: 170 lb Height: 61in Body Surface Area: 1.76 m Body Mass Index: 32.12 kg/m  Temp.: 97.48F(Temporal)  Pulse: 70 (Regular)  P.OX: 98% (Room air) BP: 142/82 (Sitting, Left Arm, Standard)       Physical Exam  General Mental Status-Alert. General Appearance-Consistent with stated age. Hydration-Well hydrated. Voice-Normal.  Head and Neck Head-normocephalic, atraumatic with no lesions or palpable masses. Trachea-midline. Thyroid Gland Characteristics - normal size and consistency.  Eye Eyeball - Bilateral-Extraocular movements intact. Sclera/Conjunctiva - Bilateral-No scleral icterus.  Chest and Lung Exam Chest and lung exam reveals -quiet, even and easy respiratory effort with no use of accessory muscles and on auscultation, normal breath sounds, no adventitious sounds and normal vocal resonance. Inspection Chest Wall - Normal. Back - normal.  Breast Note: Entire right breast ecchymotic. 4 cm hematoma palpable at 12 o'clock position right. Because of this the right breast is larger than the left. No axillary adenopathy. No other masses.   Cardiovascular Cardiovascular examination reveals -normal heart sounds, regular rate and rhythm with no murmurs and normal pedal pulses bilaterally.  Abdomen Inspection Inspection of the abdomen reveals - No Hernias. Skin - Scar - no surgical scars. Palpation/Percussion Palpation and Percussion of the abdomen reveal  - Soft, Non Tender, No Rebound tenderness, No Rigidity (guarding) and No hepatosplenomegaly. Auscultation Auscultation of the abdomen reveals - Bowel sounds normal.  Neurologic Neurologic evaluation reveals -alert and oriented  x 3 with no impairment of recent or remote memory. Mental Status-Normal.  Musculoskeletal Normal Exam - Left-Upper Extremity Strength Normal and Lower Extremity Strength Normal. Normal Exam - Right-Upper Extremity Strength Normal and Lower Extremity Strength Normal.  Lymphatic Head & Neck  General Head & Neck Lymphatics: Bilateral - Description - Normal. Axillary  General Axillary Region: Bilateral - Description - Normal. Tenderness - Non Tender. Femoral & Inguinal  Generalized Femoral & Inguinal Lymphatics: Bilateral - Description - Normal. Tenderness - Non Tender.    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, 12 o'clock position The tumor is estrogen and progesterone receptor strongly positive which is favorable You have a large bruise and a palpable hematoma of the right breast We should hold off on surgery for a month  We discussed the active surveillance trial with tamoxifen alone (COMET) You have declined that study protocol you stated that you wanted of the tumor completely removed and that is reasonable you will likely be offered radiation therapy, antiestrogen therapy, or both  Since we are going to wait a month before your surgery, we will go ahead and refer you to medical oncology and radiation oncology here in [place], since that is your preference If radiation therapy is recommended, it can probably be given in [place], closer to home  You'll be scheduled for right breast lumpectomy with radioactive seed localization I have discussed the indications, techniques, and risks of the surgery in detail  with you, your son, and daughter-in-law  Be sure to stop her aspirin 5 days prior to the surgery   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

## 2018-09-21 ENCOUNTER — Ambulatory Visit
Admission: RE | Admit: 2018-09-21 | Discharge: 2018-09-21 | Disposition: A | Discharge status: Home / Self Care | Payer: Medicare Other | Source: Ambulatory Visit | Attending: General Surgery | Admitting: General Surgery

## 2018-09-21 ENCOUNTER — Other Ambulatory Visit: Payer: Self-pay

## 2018-09-21 DIAGNOSIS — Z170 Estrogen receptor positive status [ER+]: Principal | ICD-10-CM

## 2018-09-21 DIAGNOSIS — C50111 Malignant neoplasm of central portion of right female breast: Secondary | ICD-10-CM

## 2018-09-21 MED ORDER — CEFAZOLIN SODIUM-DEXTROSE 2-4 GM/100ML-% IV SOLN
2.0000 g | INTRAVENOUS | Status: AC
  Administered 2018-09-22: 2 g via INTRAVENOUS
  Filled 2018-09-21: qty 100

## 2018-09-22 ENCOUNTER — Ambulatory Visit (HOSPITAL_COMMUNITY): Payer: Medicare Other | Admitting: Physician Assistant

## 2018-09-22 ENCOUNTER — Encounter (HOSPITAL_COMMUNITY)
Admission: RE | Disposition: A | Discharge status: Home / Self Care | Payer: Self-pay | Source: Home / Self Care | Attending: General Surgery

## 2018-09-22 ENCOUNTER — Ambulatory Visit (HOSPITAL_COMMUNITY)
Admission: RE | Admit: 2018-09-22 | Discharge: 2018-09-22 | Disposition: A | Discharge status: Home / Self Care | Payer: Medicare Other | Attending: General Surgery | Admitting: General Surgery

## 2018-09-22 ENCOUNTER — Ambulatory Visit (HOSPITAL_COMMUNITY): Payer: Medicare Other | Admitting: Anesthesiology

## 2018-09-22 ENCOUNTER — Ambulatory Visit
Admission: RE | Admit: 2018-09-22 | Discharge: 2018-09-22 | Disposition: A | Discharge status: Home / Self Care | Payer: Medicare Other | Source: Ambulatory Visit | Attending: General Surgery | Admitting: General Surgery

## 2018-09-22 ENCOUNTER — Encounter (HOSPITAL_COMMUNITY): Payer: Self-pay | Admitting: *Deleted

## 2018-09-22 DIAGNOSIS — E039 Hypothyroidism, unspecified: Secondary | ICD-10-CM | POA: Diagnosis not present

## 2018-09-22 DIAGNOSIS — L7632 Postprocedural hematoma of skin and subcutaneous tissue following other procedure: Secondary | ICD-10-CM | POA: Diagnosis not present

## 2018-09-22 DIAGNOSIS — Y848 Other medical procedures as the cause of abnormal reaction of the patient, or of later complication, without mention of misadventure at the time of the procedure: Secondary | ICD-10-CM | POA: Insufficient documentation

## 2018-09-22 DIAGNOSIS — Z7982 Long term (current) use of aspirin: Secondary | ICD-10-CM | POA: Diagnosis not present

## 2018-09-22 DIAGNOSIS — D0511 Intraductal carcinoma in situ of right breast: Secondary | ICD-10-CM

## 2018-09-22 DIAGNOSIS — Z79890 Hormone replacement therapy (postmenopausal): Secondary | ICD-10-CM | POA: Insufficient documentation

## 2018-09-22 DIAGNOSIS — E119 Type 2 diabetes mellitus without complications: Secondary | ICD-10-CM | POA: Insufficient documentation

## 2018-09-22 DIAGNOSIS — I10 Essential (primary) hypertension: Secondary | ICD-10-CM | POA: Insufficient documentation

## 2018-09-22 DIAGNOSIS — Z79899 Other long term (current) drug therapy: Secondary | ICD-10-CM | POA: Diagnosis not present

## 2018-09-22 DIAGNOSIS — C50111 Malignant neoplasm of central portion of right female breast: Secondary | ICD-10-CM

## 2018-09-22 DIAGNOSIS — Z170 Estrogen receptor positive status [ER+]: Secondary | ICD-10-CM | POA: Diagnosis not present

## 2018-09-22 DIAGNOSIS — K219 Gastro-esophageal reflux disease without esophagitis: Secondary | ICD-10-CM | POA: Insufficient documentation

## 2018-09-22 DIAGNOSIS — Z794 Long term (current) use of insulin: Secondary | ICD-10-CM | POA: Insufficient documentation

## 2018-09-22 DIAGNOSIS — Z8673 Personal history of transient ischemic attack (TIA), and cerebral infarction without residual deficits: Secondary | ICD-10-CM | POA: Insufficient documentation

## 2018-09-22 HISTORY — PX: BREAST LUMPECTOMY WITH RADIOACTIVE SEED LOCALIZATION: SHX6424

## 2018-09-22 LAB — GLUCOSE, CAPILLARY
Glucose-Capillary: 196 mg/dL — ABNORMAL HIGH (ref 70–99)
Glucose-Capillary: 164 mg/dL — ABNORMAL HIGH (ref 70–99)

## 2018-09-22 SURGERY — BREAST LUMPECTOMY WITH RADIOACTIVE SEED LOCALIZATION
Anesthesia: General | Site: Breast | Laterality: Right

## 2018-09-22 MED ORDER — ACETAMINOPHEN 650 MG RE SUPP: 650.0000 mg | RECTAL | Status: DC | PRN

## 2018-09-22 MED ORDER — SUCCINYLCHOLINE CHLORIDE 200 MG/10ML IV SOSY
PREFILLED_SYRINGE | INTRAVENOUS | Status: AC
  Filled 2018-09-22: qty 10

## 2018-09-22 MED ORDER — LACTATED RINGERS IV SOLN
INTRAVENOUS | Status: DC | PRN
  Administered 2018-09-22: 07:00:00 via INTRAVENOUS

## 2018-09-22 MED ORDER — SUCCINYLCHOLINE CHLORIDE 200 MG/10ML IV SOSY
PREFILLED_SYRINGE | INTRAVENOUS | Status: DC | PRN
  Administered 2018-09-22: 60 mg via INTRAVENOUS

## 2018-09-22 MED ORDER — DEXAMETHASONE SODIUM PHOSPHATE 10 MG/ML IJ SOLN
INTRAMUSCULAR | Status: DC | PRN
  Administered 2018-09-22: 4 mg via INTRAVENOUS

## 2018-09-22 MED ORDER — ONDANSETRON HCL 4 MG/2ML IJ SOLN
INTRAMUSCULAR | Status: DC | PRN
  Administered 2018-09-22: 4 mg via INTRAVENOUS

## 2018-09-22 MED ORDER — BUPIVACAINE-EPINEPHRINE 0.25% -1:200000 IJ SOLN
INTRAMUSCULAR | Status: DC | PRN
  Administered 2018-09-22: 9 mL

## 2018-09-22 MED ORDER — FENTANYL CITRATE (PF) 250 MCG/5ML IJ SOLN
INTRAMUSCULAR | Status: DC | PRN
  Administered 2018-09-22: 50 ug via INTRAVENOUS

## 2018-09-22 MED ORDER — LIDOCAINE 2% (20 MG/ML) 5 ML SYRINGE
INTRAMUSCULAR | Status: AC
  Filled 2018-09-22: qty 5

## 2018-09-22 MED ORDER — ACETAMINOPHEN 325 MG PO TABS: 650.0000 mg | ORAL_TABLET | ORAL | Status: DC | PRN

## 2018-09-22 MED ORDER — KETOROLAC TROMETHAMINE 15 MG/ML IJ SOLN
INTRAMUSCULAR | Status: AC
  Filled 2018-09-22: qty 1

## 2018-09-22 MED ORDER — OXYCODONE HCL 5 MG PO TABS
ORAL_TABLET | ORAL | Status: AC
  Filled 2018-09-22: qty 1

## 2018-09-22 MED ORDER — DEXAMETHASONE SODIUM PHOSPHATE 10 MG/ML IJ SOLN
INTRAMUSCULAR | Status: AC
  Filled 2018-09-22: qty 1

## 2018-09-22 MED ORDER — FENTANYL CITRATE (PF) 100 MCG/2ML IJ SOLN
25.0000 ug | INTRAMUSCULAR | Status: DC | PRN
  Administered 2018-09-22: 25 ug via INTRAVENOUS

## 2018-09-22 MED ORDER — LIDOCAINE 2% (20 MG/ML) 5 ML SYRINGE
INTRAMUSCULAR | Status: DC | PRN
  Administered 2018-09-22: 60 mg via INTRAVENOUS

## 2018-09-22 MED ORDER — KETOROLAC TROMETHAMINE 15 MG/ML IJ SOLN
15.0000 mg | Freq: Once | INTRAMUSCULAR | Status: AC | PRN
  Administered 2018-09-22: 15 mg via INTRAVENOUS

## 2018-09-22 MED ORDER — MIDAZOLAM HCL 2 MG/2ML IJ SOLN
INTRAMUSCULAR | Status: AC
  Filled 2018-09-22: qty 2

## 2018-09-22 MED ORDER — EPHEDRINE SULFATE-NACL 50-0.9 MG/10ML-% IV SOSY
PREFILLED_SYRINGE | INTRAVENOUS | Status: DC | PRN
  Administered 2018-09-22: 10 mg via INTRAVENOUS
  Administered 2018-09-22 (×3): 5 mg via INTRAVENOUS

## 2018-09-22 MED ORDER — ACETAMINOPHEN 325 MG PO TABS
ORAL_TABLET | ORAL | Status: AC
  Filled 2018-09-22: qty 2

## 2018-09-22 MED ORDER — GABAPENTIN 300 MG PO CAPS
300.0000 mg | ORAL_CAPSULE | ORAL | Status: AC
  Administered 2018-09-22: 300 mg via ORAL
  Filled 2018-09-22: qty 3

## 2018-09-22 MED ORDER — OXYCODONE HCL 5 MG PO TABS: 5.0000 mg | ORAL_TABLET | ORAL | Status: DC | PRN

## 2018-09-22 MED ORDER — FENTANYL CITRATE (PF) 250 MCG/5ML IJ SOLN
INTRAMUSCULAR | Status: AC
  Filled 2018-09-22: qty 5

## 2018-09-22 MED ORDER — ACETAMINOPHEN 500 MG PO TABS
1000.0000 mg | ORAL_TABLET | ORAL | Status: AC
  Administered 2018-09-22: 1000 mg via ORAL
  Filled 2018-09-22: qty 2

## 2018-09-22 MED ORDER — CHLORHEXIDINE GLUCONATE CLOTH 2 % EX PADS: 6.0000 | MEDICATED_PAD | Freq: Once | CUTANEOUS | Status: DC

## 2018-09-22 MED ORDER — ONDANSETRON HCL 4 MG/2ML IJ SOLN
4.0000 mg | Freq: Once | INTRAMUSCULAR | Status: AC | PRN
  Administered 2018-09-22: 4 mg via INTRAVENOUS

## 2018-09-22 MED ORDER — HYDROCODONE-ACETAMINOPHEN 5-325 MG PO TABS: 1.0000 | ORAL_TABLET | Freq: Four times a day (QID) | ORAL | 0 refills | Status: DC | PRN

## 2018-09-22 MED ORDER — ONDANSETRON HCL 4 MG/2ML IJ SOLN
INTRAMUSCULAR | Status: AC
  Filled 2018-09-22: qty 2

## 2018-09-22 MED ORDER — EPHEDRINE 5 MG/ML INJ
INTRAVENOUS | Status: AC
  Filled 2018-09-22: qty 10

## 2018-09-22 MED ORDER — BUPIVACAINE-EPINEPHRINE (PF) 0.25% -1:200000 IJ SOLN
INTRAMUSCULAR | Status: AC
  Filled 2018-09-22: qty 30

## 2018-09-22 MED ORDER — MIDAZOLAM HCL 5 MG/5ML IJ SOLN
INTRAMUSCULAR | Status: DC | PRN
  Administered 2018-09-22: 1 mg via INTRAVENOUS

## 2018-09-22 MED ORDER — FENTANYL CITRATE (PF) 100 MCG/2ML IJ SOLN
INTRAMUSCULAR | Status: AC
  Filled 2018-09-22: qty 2

## 2018-09-22 MED ORDER — PROPOFOL 10 MG/ML IV BOLUS
INTRAVENOUS | Status: DC | PRN
  Administered 2018-09-22: 150 mg via INTRAVENOUS

## 2018-09-22 MED ORDER — 0.9 % SODIUM CHLORIDE (POUR BTL) OPTIME
TOPICAL | Status: DC | PRN
  Administered 2018-09-22: 1000 mL

## 2018-09-22 MED ORDER — PROPOFOL 10 MG/ML IV BOLUS
INTRAVENOUS | Status: AC
  Filled 2018-09-22: qty 20

## 2018-09-22 SURGICAL SUPPLY — 41 items
ADH SKN CLS APL DERMABOND .7 (GAUZE/BANDAGES/DRESSINGS) ×1
APPLIER CLIP 9.375 MED OPEN (MISCELLANEOUS) ×3
APR CLP MED 9.3 20 MLT OPN (MISCELLANEOUS) ×1
BINDER BREAST LRG (GAUZE/BANDAGES/DRESSINGS) IMPLANT
BINDER BREAST XLRG (GAUZE/BANDAGES/DRESSINGS) ×2 IMPLANT
CANISTER SUCT 3000ML PPV (MISCELLANEOUS) ×3 IMPLANT
CHLORAPREP W/TINT 26ML (MISCELLANEOUS) ×3 IMPLANT
CLIP APPLIE 9.375 MED OPEN (MISCELLANEOUS) ×1 IMPLANT
COVER PROBE W GEL 5X96 (DRAPES) ×3 IMPLANT
COVER SURGICAL LIGHT HANDLE (MISCELLANEOUS) ×3 IMPLANT
COVER WAND RF STERILE (DRAPES) ×1 IMPLANT
DERMABOND ADVANCED (GAUZE/BANDAGES/DRESSINGS) ×2
DERMABOND ADVANCED .7 DNX12 (GAUZE/BANDAGES/DRESSINGS) ×1 IMPLANT
DEVICE DUBIN SPECIMEN MAMMOGRA (MISCELLANEOUS) ×3 IMPLANT
DRAPE CHEST BREAST 15X10 FENES (DRAPES) ×3 IMPLANT
DRSG PAD ABDOMINAL 8X10 ST (GAUZE/BANDAGES/DRESSINGS) ×3 IMPLANT
ELECT CAUTERY BLADE 6.4 (BLADE) ×3 IMPLANT
ELECT REM PT RETURN 9FT ADLT (ELECTROSURGICAL) ×3
ELECTRODE REM PT RTRN 9FT ADLT (ELECTROSURGICAL) ×1 IMPLANT
GAUZE SPONGE 4X4 12PLY STRL LF (GAUZE/BANDAGES/DRESSINGS) ×3 IMPLANT
GLOVE EUDERMIC 7 POWDERFREE (GLOVE) ×6 IMPLANT
GOWN STRL REUS W/ TWL LRG LVL3 (GOWN DISPOSABLE) ×1 IMPLANT
GOWN STRL REUS W/ TWL XL LVL3 (GOWN DISPOSABLE) ×1 IMPLANT
GOWN STRL REUS W/TWL LRG LVL3 (GOWN DISPOSABLE) ×3
GOWN STRL REUS W/TWL XL LVL3 (GOWN DISPOSABLE) ×3
ILLUMINATOR WAVEGUIDE N/F (MISCELLANEOUS) IMPLANT
KIT BASIN OR (CUSTOM PROCEDURE TRAY) ×3 IMPLANT
KIT MARKER MARGIN INK (KITS) ×3 IMPLANT
LIGHT WAVEGUIDE WIDE FLAT (MISCELLANEOUS) IMPLANT
NDL HYPO 25GX1X1/2 BEV (NEEDLE) ×1 IMPLANT
NEEDLE HYPO 25GX1X1/2 BEV (NEEDLE) ×3 IMPLANT
NS IRRIG 1000ML POUR BTL (IV SOLUTION) ×3 IMPLANT
PACK GENERAL/GYN (CUSTOM PROCEDURE TRAY) ×3 IMPLANT
PAD ABD 8X10 STRL (GAUZE/BANDAGES/DRESSINGS) ×2 IMPLANT
SPONGE LAP 4X18 RFD (DISPOSABLE) ×3 IMPLANT
SUT MNCRL AB 4-0 PS2 18 (SUTURE) ×3 IMPLANT
SUT SILK 2 0 SH (SUTURE) ×3 IMPLANT
SUT VIC AB 3-0 SH 18 (SUTURE) ×3 IMPLANT
SYR CONTROL 10ML LL (SYRINGE) ×3 IMPLANT
TOWEL OR 17X24 6PK STRL BLUE (TOWEL DISPOSABLE) ×3 IMPLANT
TOWEL OR 17X26 10 PK STRL BLUE (TOWEL DISPOSABLE) ×3 IMPLANT

## 2018-09-22 NOTE — Discharge Instructions (Signed)
[facility] °Office Phone Number [phone] ° °BREAST BIOPSY/ PARTIAL MASTECTOMY: POST OP INSTRUCTIONS ° °Always review your discharge instruction sheet given to you by the facility where your surgery was performed. ° °IF YOU HAVE DISABILITY OR FAMILY LEAVE FORMS, YOU MUST BRING THEM TO THE OFFICE FOR PROCESSING.  DO NOT GIVE THEM TO YOUR DOCTOR. ° °1. A prescription for pain medication may be given to you upon discharge.  Take your pain medication as prescribed, if needed.  If narcotic pain medicine is not needed, then you may take acetaminophen (Tylenol) or ibuprofen (Advil) as needed. °2. Take your usually prescribed medications unless otherwise directed °3. If you need a refill on your pain medication, please contact your pharmacy.  They will contact our office to request authorization.  Prescriptions will not be filled after 5pm or on week-ends. °4. You should eat very light the first 24 hours after surgery, such as soup, crackers, pudding, etc.  Resume your normal diet the day after surgery. °5. Most patients will experience some swelling and bruising in the breast.  Ice packs and a good support bra will help.  Swelling and bruising can take several days to resolve.  °6. It is common to experience some constipation if taking pain medication after surgery.  Increasing fluid intake and taking a stool softener will usually help or prevent this problem from occurring.  A mild laxative (Milk of Magnesia or Miralax) should be taken according to package directions if there are no bowel movements after 48 hours. °7. Unless discharge instructions indicate otherwise, you may remove your bandages 24-48 hours after surgery, and you may shower at that time.  You may have steri-strips (small skin tapes) in place directly over the incision.  These strips should be left on the skin for 7-10 days.  If your surgeon used skin glue on the incision, you may shower in 24 hours.  The glue will flake off over the  next 2-3 weeks.  Any sutures or staples will be removed at the office during your follow-up visit. °8. ACTIVITIES:  You may resume regular daily activities (gradually increasing) beginning the next day.  Wearing a good support bra or sports bra minimizes pain and swelling.  You may have sexual intercourse when it is comfortable. °a. You may drive when you no longer are taking prescription pain medication, you can comfortably wear a seatbelt, and you can safely maneuver your car and apply brakes. °b. RETURN TO WORK:  ______________________________________________________________________________________ °9. You should see your doctor in the office for a follow-up appointment approximately two weeks after your surgery.  Your doctor’s nurse will typically make your follow-up appointment when she calls you with your pathology report.  Expect your pathology report 2-3 business days after your surgery.  You may call to check if you do not hear from us after three days. °10. OTHER INSTRUCTIONS: _______________________________________________________________________________________________ _____________________________________________________________________________________________________________________________________ °_____________________________________________________________________________________________________________________________________ °_____________________________________________________________________________________________________________________________________ ° °WHEN TO CALL YOUR DOCTOR: °1. Fever over 101.0 °2. Nausea and/or vomiting. °3. Extreme swelling or bruising. °4. Continued bleeding from incision. °5. Increased pain, redness, or drainage from the incision. ° °The clinic staff is available to answer your questions during regular business hours.  Please don’t hesitate to call and ask to speak to one of the nurses for clinical concerns.  If you have a medical emergency, go to the nearest  emergency room or call 911.  A surgeon from [facility] is always on call at the hospital. ° °For further questions, please visit [email or web address]  ° ° ° ° ° ° ° ° ° ° °••••••••• ° ° °  Managing Your Pain After Surgery Without Opioids ° ° ° °Thank you for participating in our program to help patients manage their pain after surgery without opioids. This is part of our effort to provide you with the best care possible, without exposing you or your family to the risk that opioids pose. ° °What pain can I expect after surgery? °You can expect to have some pain after surgery. This is normal. The pain is typically worse the day after surgery, and quickly begins to get better. °Many studies have found that many patients are able to manage their pain after surgery with Over-the-Counter (OTC) medications such as Tylenol and Motrin. If you have a condition that does not allow you to take Tylenol or Motrin, notify your surgical team. ° °How will I manage my pain? °The best strategy for controlling your pain after surgery is around the clock pain control with Tylenol (acetaminophen) and Motrin (ibuprofen or Advil). Alternating these medications with each other allows you to maximize your pain control. In addition to Tylenol and Motrin, you can use heating pads or ice packs on your incisions to help reduce your pain. ° °How will I alternate your regular strength over-the-counter pain medication? °You will take a dose of pain medication every three hours. °; Start by taking 650 mg of Tylenol (2 pills of 325 mg) °; 3 hours later take 600 mg of Motrin (3 pills of 200 mg) °; 3 hours after taking the Motrin take 650 mg of Tylenol °; 3 hours after that take 600 mg of Motrin. ° ° °- 1 - ° °See example - if your first dose of Tylenol is at 12:00 PM ° ° °12:00 PM Tylenol 650 mg (2 pills of 325 mg)  °3:00 PM Motrin 600 mg (3 pills of 200 mg)  °6:00 PM Tylenol 650 mg (2 pills of 325 mg)  °9:00 PM Motrin 600 mg (3  pills of 200 mg)  °Continue alternating every 3 hours  ° °We recommend that you follow this schedule around-the-clock for at least 3 days after surgery, or until you feel that it is no longer needed. Use the table on the last page of this handout to keep track of the medications you are taking. °Important: °Do not take more than 3000mg of Tylenol or 3200mg of Motrin in a 24-hour period. °Do not take ibuprofen/Motrin if you have a history of bleeding stomach ulcers, severe kidney disease, &/or actively taking a blood thinner ° °What if I still have pain? °If you have pain that is not controlled with the over-the-counter pain medications (Tylenol and Motrin or Advil) you might have what we call “breakthrough” pain. You will receive a prescription for a small amount of an opioid pain medication such as Oxycodone, Tramadol, or Tylenol with Codeine. Use these opioid pills in the first 24 hours after surgery if you have breakthrough pain. Do not take more than 1 pill every 4-6 hours. ° °If you still have uncontrolled pain after using all opioid pills, don't hesitate to call our staff using the number provided. We will help make sure you are managing your pain in the best way possible, and if necessary, we can provide a prescription for additional pain medication. ° ° °Day 1   ° °Time  °Name of Medication Number of pills taken  °Amount of Acetaminophen  °Pain Level  ° °Comments  °AM PM       °AM PM       °AM PM       °  AM PM       °AM PM       °AM PM       °AM PM       °AM PM       °Total Daily amount of Acetaminophen °Do not take more than  3,000 mg per day    ° ° °Day 2   ° °Time  °Name of Medication Number of pills °taken  °Amount of Acetaminophen  °Pain Level  ° °Comments  °AM PM       °AM PM       °AM PM       °AM PM       °AM PM       °AM PM       °AM PM       °AM PM       °Total Daily amount of Acetaminophen °Do not take more than  3,000 mg per day    ° ° °Day 3   ° °Time  °Name of Medication Number of pills taken    °Amount of Acetaminophen  °Pain Level  ° °Comments  °AM PM       °AM PM       °AM PM       °AM PM       ° ° ° °AM PM       °AM PM       °AM PM       °AM PM       °Total Daily amount of Acetaminophen °Do not take more than  3,000 mg per day    ° ° °Day 4   ° °Time  °Name of Medication Number of pills taken  °Amount of Acetaminophen  °Pain Level  ° °Comments  °AM PM       °AM PM       °AM PM       °AM PM       °AM PM       °AM PM       °AM PM       °AM PM       °Total Daily amount of Acetaminophen °Do not take more than  3,000 mg per day    ° ° °Day 5   ° °Time  °Name of Medication Number °of pills taken  °Amount of Acetaminophen  °Pain Level  ° °Comments  °AM PM       °AM PM       °AM PM       °AM PM       °AM PM       °AM PM       °AM PM       °AM PM       °Total Daily amount of Acetaminophen °Do not take more than  3,000 mg per day    ° ° ° °Day 6   ° °Time  °Name of Medication Number of pills °taken  °Amount of Acetaminophen  °Pain Level  °Comments  °AM PM       °AM PM       °AM PM       °AM PM       °AM PM       °AM PM       °AM PM       °AM PM       °Total Daily amount of Acetaminophen °Do not take more than    3,000 mg per day    ° ° °Day 7   ° °Time  °Name of Medication Number of pills taken  °Amount of Acetaminophen  °Pain Level  ° °Comments  °AM PM       °AM PM       °AM PM       °AM PM       °AM PM       °AM PM       °AM PM       °AM PM       °Total Daily amount of Acetaminophen °Do not take more than  3,000 mg per day    ° ° ° ° °For additional information about how and where to safely dispose of unused opioid °medications - https://www.morepowerfulnc.org ° °Disclaimer: This document contains information and/or instructional materials adapted from [facility] for the typical patient with your condition. It does not replace medical advice from your health care provider because your experience may differ from that of the °typical patient. Talk to your health care provider if you have any questions about  this °document, your condition or your treatment plan. °Adapted from [facility] ° °

## 2018-09-22 NOTE — Transfer of Care (Signed)
Immediate Anesthesia Transfer of Care Note  Patient: Carol Wood  Procedure(s) Performed: RIGHT BREAST LUMPECTOMY WITH RADIOACTIVE SEED LOCALIZATION (Right Breast)  Patient Location: PACU  Anesthesia Type:General  Level of Consciousness: awake, alert , oriented and patient cooperative  Airway & Oxygen Therapy: Patient Spontanous Breathing and Patient connected to face mask oxygen  Post-op Assessment: Report given to RN and Post -op Vital signs reviewed and stable  Post vital signs: Reviewed and stable  Last Vitals:  Vitals Value Taken Time  BP    Temp    Pulse    Resp    SpO2      Last Pain:  Vitals:   09/22/18 0614  TempSrc:   PainSc: 0-No pain         Complications: No apparent anesthesia complications

## 2018-09-22 NOTE — Anesthesia Procedure Notes (Signed)
Procedure Name: Intubation Date/Time: 09/22/2018 7:22 AM Performed by: Renato Shin, CRNA Pre-anesthesia Checklist: Patient identified, Emergency Drugs available, Suction available and Patient being monitored Patient Re-evaluated:Patient Re-evaluated prior to induction Oxygen Delivery Method: Circle system utilized Preoxygenation: Pre-oxygenation with 100% oxygen Induction Type: IV induction and Rapid sequence Laryngoscope Size: Miller and 2 Grade View: Grade I Tube type: Oral Tube size: 7.0 mm Number of attempts: 1 Airway Equipment and Method: Stylet Placement Confirmation: ETT inserted through vocal cords under direct vision,  positive ETCO2 and breath sounds checked- equal and bilateral Secured at: 21 cm Tube secured with: Tape Dental Injury: Teeth and Oropharynx as per pre-operative assessment

## 2018-09-22 NOTE — Interval H&P Note (Signed)
History and Physical Interval Note:  09/22/2018 6:01 AM  Carol Wood  has presented today for surgery, with the diagnosis of DUCTAL CARCINOMA RIGHT BREAST.  The various methods of treatment have been discussed with the patient and family. After consideration of risks, benefits and other options for treatment, the patient has consented to  Procedure(s): RIGHT BREAST LUMPECTOMY WITH RADIOACTIVE SEED LOCALIZATION (Right) as a surgical intervention.  The patient's history has been reviewed, patient examined, no change in status, stable for surgery.  I have reviewed the patient's chart and labs.  Questions were answered to the patient's satisfaction.     Adin Hector

## 2018-09-22 NOTE — Anesthesia Postprocedure Evaluation (Signed)
Anesthesia Post Note  Patient: Carol Wood  Procedure(s) Performed: RIGHT BREAST LUMPECTOMY WITH RADIOACTIVE SEED LOCALIZATION (Right Breast)     Patient location during evaluation: PACU Anesthesia Type: General Level of consciousness: awake and alert Pain management: pain level controlled Vital Signs Assessment: post-procedure vital signs reviewed and stable Respiratory status: spontaneous breathing, nonlabored ventilation, respiratory function stable and patient connected to nasal cannula oxygen Cardiovascular status: blood pressure returned to baseline and stable Postop Assessment: no apparent nausea or vomiting Anesthetic complications: no    Last Vitals:  Vitals:   09/22/18 0845 09/22/18 0859  BP: (!) 167/59   Pulse: 70 67  Resp: 15 12  Temp:    SpO2: 100% 100%    Last Pain:  Vitals:   09/22/18 0859  TempSrc:   PainSc: 0-No pain                 Dafney Farler P Zeriah Baysinger

## 2018-09-22 NOTE — Op Note (Addendum)
Patient Name:           Carol Wood   Date of Surgery:        09/22/2018  Pre op Diagnosis:      Ductal carcinoma in situ right breast, receptor positive                                      Large postbiopsy hematoma right breast  Post op Diagnosis:    Same  Procedure:                 Right breast lumpectomy with radioactive seed localization                                      Excision large organized postbiopsy hematoma right breast  Surgeon:                     Edsel Petrin. Dalbert Batman, M.D., FACS  Assistant:                      OR staff  Operative Indications:    This is a very pleasant 73 year old female. Referred by Dr. Woody Seller, her PCP and the BCG for DCIS right breast. .      She has no prior history of breast problems. Annual screening mammograms recently demonstrated a new finding. Small area of distortion in the right breast up in the 12 o'clock position well above the areola. Mammograms at [facility] show that the left breast is normal. Category B density. 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 She takes aspirin       Past history significant for insulin-dependent diabetes, hypertension, hypothyroidism, GERD, TIA remotely, lumbar laminectomy, tubal ligation. Takes aspirin Family history negative for breast or ovarian cancer We discussed standard therapy with a lumpectomy, possible radiation therapy, possible antiestrogen therapy. Also discussed the active surveillance trial Called COMET. She declined the active surveillance study and wants to have cancer removed from her breast. That is reasonable She will be scheduled for right breast lumpectomy with radioactive seed localization Because of the hematoma and the bruising   we waited almost 4 weeks but the hematoma is still large.  She very much  wants the hematoma excised as well and is willing to accept some volume loss.     I discussed the indications, details, techniques, numerous risk of the surgery with her. She is aware of the risk of bleeding, infection, reoperation for positive margins or upgrade to invasive cancer, cosmetic deformity, chronic pain. She understands all of these issues. All of her questions are answered. She agrees with this plan  Operative Findings:       The radioactive seed and the original titanium marker clip were immediately adjacent to each other at the 12 o'clock position of the right breast.  They were localized to the more superior aspect of the hematoma.  The hematoma itself was almost 10 cm in size.  It was organized.  We resected the entire hematoma.  The specimen mammogram looked good and the seed in the marker clip appeared to be well contained with a good margin.  Procedure in Detail:  Following the induction of general endotracheal anesthesia the patient's right breast was prepped and draped in a sterile fashion.  Surgical timeout was performed.  Intravenous antibiotics were given.  0.25% Marcaine with epinephrine was used as local infiltration anesthetic.      Using the neoprobe isolated the radioactive signal at the 12 o'clock position.  I drew a transverse elliptical incision overlying the cancer and the hematoma.  The elliptical incision was made with a knife.  The rest of the procedure was performed with electrocautery and the neoprobe.  We excised all of this tissue, including the cancef and the hematoma..   The specimen was removed and marked with silk sutures and a 6 color ink.  The specimen mammogram looked good and contained the seed in the marker clip.  Specimen was sent to the pathology lab.  Hemostasis was excellent and achieved electrocautery.  The wound was irrigated.  5 metal marker clips were placed in the walls of the lumpectomy cavity.  The breast tissues were reapproximated in  layers with 2-0 Vicryl and 3-0 Vicryl sutures.  The skin was closed with a running subcuticular 4-0 Monocryl and Dermabond.  Dry bandages and a breast binder were placed.  The patient tolerated the procedure well and was taken to PACU in stable condition.  EBL 25 cc.  Counts correct.  Complications none.     Addendum: I logged onto the PMP aware website and reviewed her prescription medication history.     Edsel Petrin. Dalbert Batman, M.D., FACS General and Minimally Invasive Surgery Breast and Colorectal Surgery  09/22/2018 8:05 AM

## 2018-09-23 ENCOUNTER — Encounter (HOSPITAL_COMMUNITY): Payer: Self-pay | Admitting: General Surgery

## 2018-09-28 NOTE — Progress Notes (Signed)
HEMATOLOGY-ONCOLOGY TELEPHONE VISIT PROGRESS NOTE  I connected with  on 09/29/18 at  2:30 PM EDT by telephone and verified that I am speaking with the correct person using two identifiers.  I discussed the limitations, risks, security and privacy concerns of performing an evaluation and management service by telephone and the availability of in person appointments.  I also discussed with the patient that there may be a patient responsible charge related to this service. The patient expressed understanding and agreed to proceed.   Patient location: Home Physician location: Clinic  History of Present Illness: Follow-up to discuss recent surgery    Ductal carcinoma in situ (DCIS) of right breast   08/17/2018 Initial Diagnosis    Screening mammogram [facility] detected distortion in the right breast 12 o'clock position.  Biopsy revealed intermediate grade DCIS with calcifications, ER 100%, PR 100%, Tis NX stage 0    09/22/2018 Surgery    Left lumpectomy (Dr. Dalbert Batman): DCIS, 1.5cm, intermediate grade, margins negative, ER 100%, PR 100%.      REVIEW OF SYSTEMS:   Constitutional: Denies fevers, chills or abnormal weight loss Eyes: Denies blurriness of vision Ears, nose, mouth, throat, and face: Denies mucositis or sore throat Respiratory: Denies cough, dyspnea or wheezes Cardiovascular: Denies palpitation, chest discomfort Gastrointestinal:  Denies nausea, heartburn or change in bowel habits Skin: Denies abnormal skin rashes Lymphatics: Denies new lymphadenopathy or easy bruising Neurological:Denies numbness, tingling or new weaknesses Behavioral/Psych: Mood is stable, no new changes  Extremities: No lower extremity edema Breast: Recent left lumpectomy All other systems were reviewed with the patient and are negative.  Observations/Objective:  Patient is doing quite well from recent surgery.  Moderate pain and discomfort which have improved with time.   Assessment Plan:  Ductal  carcinoma in situ (DCIS) of right breast 08/17/2018:Screening mammogram [facility] detected distortion in the right breast 12 o'clock position.  Biopsy revealed intermediate grade DCIS with calcifications, ER 100%, PR 100%, Tis NX stage 0  09/22/2018:Left lumpectomy (Dr. Dalbert Batman): DCIS, 1.5cm, intermediate grade, margins negative, ER 100%, PR 100% Tis NX stage 0  Pathology counseling: I discussed the final pathology report  I discussed the margins as well. We also discussed the final staging along with previously performed ER/PR  Testing.  Recommendation: 1.  Adjuvant radiation therapy 2.  Other adjuvant antiestrogen therapy with tamoxifen x5 years.  Because of coronavirus, we will start her on tamoxifen at this time.  After a few months when the viral infection subsides, radiation therapy can be planned.  Return to clinic in 3 months for follow-up.   I discussed the assessment and treatment plan with the patient. The patient was provided an opportunity to ask questions and all were answered. The patient agreed with the plan and demonstrated an understanding of the instructions. The patient was advised to call back or seek an in-person evaluation if the symptoms worsen or if the condition fails to improve as anticipated.   I provided 15 minutes of non-face-to-face time during this encounter. Harriette Ohara, MD

## 2018-09-29 ENCOUNTER — Inpatient Hospital Stay: Payer: Medicare Other | Attending: Hematology and Oncology | Admitting: Hematology and Oncology

## 2018-09-29 DIAGNOSIS — D0511 Intraductal carcinoma in situ of right breast: Secondary | ICD-10-CM

## 2018-09-29 MED ORDER — TAMOXIFEN CITRATE 20 MG PO TABS: 20.0000 mg | ORAL_TABLET | Freq: Every day | ORAL | 3 refills | Status: DC

## 2018-09-29 NOTE — Assessment & Plan Note (Signed)
08/17/2018:Screening mammogram [facility] detected distortion in the right breast 12 o'clock position.  Biopsy revealed intermediate grade DCIS with calcifications, ER 100%, PR 100%, Tis NX stage 0  09/22/2018:Left lumpectomy (Dr. Dalbert Batman): DCIS, 1.5cm, intermediate grade, margins negative, ER 100%, PR 100% Tis NX stage 0  Pathology counseling: I discussed the final pathology report of the patient provided  a copy of this report. I discussed the margins as well. We also discussed the final staging along with previously performed ER/PR  Testing.  Recommendation: 1.  Adjuvant radiation therapy 2.  Other adjuvant antiestrogen therapy with tamoxifen x5 years.  Because of coronavirus, we will start her on tamoxifen at this time.  After a few months when the viral infection subsides, radiation therapy can be planned.  Return to clinic in 3 months for follow-up.

## 2018-10-11 ENCOUNTER — Ambulatory Visit
Admission: RE | Admit: 2018-10-11 | Discharge: 2018-10-11 | Disposition: A | Discharge status: Home / Self Care | Payer: Medicare Other | Source: Ambulatory Visit | Attending: Radiation Oncology | Admitting: Radiation Oncology

## 2018-10-11 ENCOUNTER — Ambulatory Visit: Payer: Medicare Other | Admitting: Radiation Oncology

## 2018-10-11 DIAGNOSIS — Z170 Estrogen receptor positive status [ER+]: Principal | ICD-10-CM

## 2018-10-11 DIAGNOSIS — D0511 Intraductal carcinoma in situ of right breast: Secondary | ICD-10-CM

## 2018-10-11 DIAGNOSIS — C50811 Malignant neoplasm of overlapping sites of right female breast: Secondary | ICD-10-CM

## 2018-10-11 NOTE — Progress Notes (Signed)
Radiation Oncology         [phone]   Initial Outpatient Consultation - Conducted via telephone due to current COVID-19 concerns for limiting patient exposure  I spoke with the patient to conduct this consult visit via telephone to spare the patient unnecessary potential exposure in the healthcare setting during the current COVID-19 pandemic. The patient was notified in advance and was offered a [place] meeting to allow for face to face communication but unfortunately reported that they did not have the appropriate resources/technology to support such a visit and instead preferred to proceed with a telephone consult.   ________________________________  Name: Carol Wood        MRN: [id number]  Date of Service: 10/11/2018 DOB: 05-Jan-1946  HO:OILN, Carol Hatcher, MD  Nicholas Lose, MD     REFERRING PHYSICIAN: Nicholas Lose, MD   DIAGNOSIS: The encounter diagnosis was Ductal carcinoma in situ (DCIS) of right breast.   HISTORY OF PRESENT ILLNESS: Carol Wood is a 73 y.o. female originally seen for a new diagnosis of right breast cancer. The patient was noted to have screening detected abnormality in the right breast at 12:00. The details of the size of her tumor were not documented in her radiology reports but her case was reviewed in breast oncology conference. She underwent stereotactic guided biopsy on 08/17/2018 revealing a intermediate grade DCIS with calcifications and fat necrosis.  Her tumor was ER PR positive.  She underwent right lumpectomy on 09/22/2018 which revealed a 1.5 cm intermediate grade DCIS. Her tumor was 1 mm from the inferior margin. She is contacted by phone today to discuss her course.   PREVIOUS RADIATION THERAPY: No   PAST MEDICAL HISTORY:  Past Medical History:  Diagnosis Date   Anemia    Cancer ([place])    Skin   Depressive disorder, not elsewhere classified    History of hiatal hernia    Hypothyroidism    Pneumonia    Pure hypercholesterolemia     TIA (transient ischemic attack)    Type II or unspecified type diabetes mellitus without mention of complication, not stated as uncontrolled    Unspecified essential hypertension        PAST SURGICAL HISTORY: Past Surgical History:  Procedure Laterality Date   BACK SURGERY     BREAST LUMPECTOMY WITH RADIOACTIVE SEED LOCALIZATION Right 09/22/2018   Procedure: RIGHT BREAST LUMPECTOMY WITH RADIOACTIVE SEED LOCALIZATION;  Surgeon: Fanny Skates, MD;  Location: [facility];  Service: General;  Laterality: Right;   CATARACT EXTRACTION, BILATERAL     Laser   COLONOSCOPY     COLONOSCOPY N/A 07/31/2015   Procedure: COLONOSCOPY;  Surgeon: Rogene Houston, MD;  [facility];  Service: Endoscopy;  Laterality: N/A;  1200 - moved to 2/1 @ 10:30   ESOPHAGEAL DILATION N/A 09/06/2014   Procedure: ESOPHAGEAL DILATION;  Surgeon: Rogene Houston, MD;  [facility];  Service: Endoscopy;  Laterality: N/A;   ESOPHAGOGASTRODUODENOSCOPY N/A 09/06/2014   Procedure: ESOPHAGOGASTRODUODENOSCOPY (EGD);  Surgeon: Rogene Houston, MD;  [facility];  Service: Endoscopy;  Laterality: N/A;  220   EYE SURGERY     TUBAL LIGATION       FAMILY HISTORY:  Family History  Problem Relation Age of Onset   Cancer Mother 76       Lung   Colon cancer Brother    Heart disease Son 106       CABG   Heart disease Son 73  CABG     SOCIAL HISTORY:  reports that she has never smoked. She has never used smokeless tobacco. She reports that she does not drink alcohol or use drugs. The patient is Widowed and lives in [place]. She's a retired Marine scientist.   ALLERGIES: Patient has no known allergies.   MEDICATIONS:  Current Outpatient Medications  Medication Sig Dispense Refill   ACCU-CHEK AVIVA PLUS test strip      ACCU-CHEK SOFTCLIX LANCETS lancets      aspirin 325 MG tablet Take 325 mg by mouth daily.     b complex vitamins tablet Take 1 tablet by mouth daily.      Cholecalciferol (VITAMIN D) 50 MCG (2000 UT) CAPS Take 2,000 Units by mouth 2 (two) times daily.     Flaxseed, Linseed, (FLAXSEED OIL) 1000 MG CAPS Take 1,000 mg by mouth daily.     glipiZIDE (GLUCOTROL XL) 10 MG 24 hr tablet Take 10 mg by mouth 2 (two) times daily.     glucosamine-chondroitin 500-400 MG tablet Take 1 tablet by mouth 2 (two) times daily.     HYDROcodone-acetaminophen (NORCO) 5-325 MG tablet Take 1-2 tablets by mouth every 6 (six) hours as needed for moderate pain or severe pain. 20 tablet 0   insulin glargine (LANTUS) 100 UNIT/ML injection Inject 15 Units into the skin at bedtime.      levothyroxine (SYNTHROID, LEVOTHROID) 75 MCG tablet Take 75 mcg by mouth daily before breakfast.      lisinopril (PRINIVIL,ZESTRIL) 20 MG tablet Take 20 mg by mouth 2 (two) times daily.      Magnesium 250 MG TABS Take 250 mg by mouth daily.     metoprolol succinate (TOPROL-XL) 25 MG 24 hr tablet Take 25 mg by mouth at bedtime.      Multiple Vitamin (MULTIVITAMIN WITH MINERALS) TABS tablet Take 1 tablet by mouth daily.     Multiple Vitamins-Minerals (VITEYES AREDS FORMULA PO) Take 1 tablet by mouth 2 (two) times daily.     pantoprazole (PROTONIX) 40 MG tablet Take 40 mg by mouth at bedtime.      tamoxifen (NOLVADEX) 20 MG tablet Take 1 tablet (20 mg total) by mouth daily. 90 tablet 3   vitamin C (ASCORBIC ACID) 500 MG tablet Take 500 mg by mouth daily.     No current facility-administered medications for this encounter.      REVIEW OF SYSTEMS: On review of systems, the patient reports that she is doing well overall. She feels like she's recovering well from surgery. No other complaints are noted.    PHYSICAL EXAM:  Wt Readings from Last 3 Encounters:  09/22/18 172 lb 6.4 oz (78.2 kg)  09/14/18 172 lb 6.4 oz (78.2 kg)  09/13/18 174 lb 1.6 oz (79 kg)   Temp Readings from Last 3 Encounters:  09/22/18 (!) 97.3 F (36.3 C)  09/14/18 (!) 97.5 F (36.4 C)  09/13/18 98.3 F (36.8  C) (Oral)   BP Readings from Last 3 Encounters:  09/22/18 (!) 167/59  09/14/18 (!) 148/59  09/13/18 (!) 121/56   Pulse Readings from Last 3 Encounters:  09/22/18 67  09/14/18 63  09/13/18 67  Not examined due to type of encounter  ECOG = 0  0 - Asymptomatic (Fully active, able to carry on all predisease activities without restriction)  1 - Symptomatic but completely ambulatory (Restricted in physically strenuous activity but ambulatory and able to carry out work of a light or sedentary nature. For example, light housework, office work)  2 - Symptomatic, <50% in bed during the day (Ambulatory and capable of all self care but unable to carry out any work activities. Up and about more than 50% of waking hours)  3 - Symptomatic, >50% in bed, but not bedbound (Capable of only limited self-care, confined to bed or chair 50% or more of waking hours)  4 - Bedbound (Completely disabled. Cannot carry on any self-care. Totally confined to bed or chair)  5 - Death   Eustace Pen MM, Creech RH, Tormey DC, et al. [phone]). "Toxicity and response criteria of the [facility]". [place] Oncol. 5 (6): 649-55    LABORATORY DATA:  Lab Results  Component Value Date   WBC 7.9 09/14/2018   HGB 14.4 09/14/2018   HCT 47.1 (H) 09/14/2018   MCV 94.6 09/14/2018   PLT 364 09/14/2018   Lab Results  Component Value Date   NA 139 09/14/2018   K 4.6 09/14/2018   CL 106 09/14/2018   CO2 23 09/14/2018   Lab Results  Component Value Date   ALT 28 09/14/2018   AST 26 09/14/2018   ALKPHOS 79 09/14/2018   BILITOT 0.4 09/14/2018      RADIOGRAPHY: Mm Breast Surgical Specimen  Result Date: 09/22/2018 CLINICAL DATA:  Radioactive seed localization was performed prior to lumpectomy. EXAM: SPECIMEN RADIOGRAPH OF THE RIGHT BREAST COMPARISON:  Previous exam(s). FINDINGS: Status post excision of the right breast. The radioactive seed and biopsy marker clip are present, completely intact,  and were marked for pathology. IMPRESSION: Specimen radiograph of the right breast. Electronically Signed   By: Curlene Dolphin M.D.   On: 09/22/2018 07:59   Mm Rt Radioactive Seed Loc Mammo Guide  Result Date: 09/21/2018 CLINICAL DATA:  73 year old female presenting for radioactive seed localization the right breast prior to lumpectomy. EXAM: MAMMOGRAPHIC GUIDED RADIOACTIVE SEED LOCALIZATION OF THE RIGHT BREAST COMPARISON:  Previous exam(s). FINDINGS: Patient presents for radioactive seed localization prior to right breast lumpectomy. I met with the patient and we discussed the procedure of seed localization including benefits and alternatives. We discussed the high likelihood of a successful procedure. We discussed the risks of the procedure including infection, bleeding, tissue injury and further surgery. We discussed the low dose of radioactivity involved in the procedure. Informed, written consent was given. The usual time-out protocol was performed immediately prior to the procedure. Seed localization was initially attempted from a superior approach, however liquid drained from the puncture site from the patient's large underlying hematoma, which resulted following her biopsy. Therefore, before the seed was placed she was taken to ultrasound and an aspiration was attempted. I was concerned that if there was significant fluid draining from the hematoma, it could dislodge the seed. However, the fluid in the hematoma was very complex, and despite attempting to aspirate different areas of the hematoma, no fluid could be aspirated. Therefore, she was taken back to the mammogram Unit for seed placement. Using mammographic guidance, sterile technique, 1% lidocaine and an I-125 radioactive seed, the coil shaped biopsy marking clip in the superior central right breast was localized using a superior approach. The follow-up mammogram images confirm the seed in the expected location and were marked for Dr. Dalbert Batman.  Follow-up survey of the patient confirms presence of the radioactive seed. Order number of I-125 seed:  [id number]. Total activity:  1.191 millicuries reference Date: 09/06/2018 The patient tolerated the procedure well and was released from the [facility]. She was given instructions regarding seed removal. IMPRESSION: Radioactive  seed localization right breast. No apparent complications. Electronically Signed   By: Ammie Ferrier M.D.   On: 09/21/2018 12:41       IMPRESSION/PLAN: 1. ER/PR positive intermediate grade DCIS of the right breast. Dr. Lisbeth Renshaw is aware of her case and has reviewed her results. No additional surgery appears to be planned though her margins were close  We reviewed the rationale to proceed with radiotherapy and the patient is interested in remaining aggressive to locally reduce her risk of recurrence. We discussed the risks, benefits, short, and long term effects of radiotherapy, and the patient is interested in proceeding. I discussed the delivery and logistics of radiotherapy and Dr. Lisbeth Renshaw recommends a course of 4 weeks of radiotherapy. We will plan simulation on 11/03/2018, at which time she will be consented, however verbal consent was given today.   Given current concerns for patient exposure during the COVID-19 pandemic, this encounter was conducted via telephone.  The patient has given verbal consent for this type of encounter. The time spent during this encounter was 25 minutes and 50% of that time was spent in the coordination of his care. The attendants for this meeting include Shona Simpson, [facility] and Dyke Brackett  During the encounter, Shona Simpson [facility] was located at [facility] Radiation Oncology Department.  Nyisha Clippard  was located at home.    Carola Rhine, PAC

## 2018-10-11 NOTE — Progress Notes (Signed)
See progress note under physician encounter. 

## 2018-11-01 ENCOUNTER — Telehealth: Payer: Self-pay | Admitting: Radiation Oncology

## 2018-11-01 DIAGNOSIS — D0511 Intraductal carcinoma in situ of right breast: Secondary | ICD-10-CM

## 2018-11-01 NOTE — Telephone Encounter (Signed)
The patient called with concerns about coronavirus and coming to [facility] and would rather be treated in [place]. We will cancel her simulation as well.

## 2018-11-03 ENCOUNTER — Ambulatory Visit: Payer: Medicare Other | Admitting: Radiation Oncology

## 2018-11-10 ENCOUNTER — Encounter: Payer: Self-pay | Admitting: *Deleted

## 2018-12-08 ENCOUNTER — Encounter: Payer: Self-pay | Admitting: *Deleted

## 2018-12-09 ENCOUNTER — Telehealth: Payer: Self-pay | Admitting: Hematology and Oncology

## 2018-12-09 NOTE — Telephone Encounter (Signed)
Not able to reach patient or leave message re July f/u. Schedule mailed.

## 2018-12-23 ENCOUNTER — Telehealth: Payer: Self-pay | Admitting: Hematology and Oncology

## 2018-12-23 NOTE — Telephone Encounter (Signed)
I talk with patient regarding visit 7/2 she prefers phone visit

## 2018-12-23 NOTE — Assessment & Plan Note (Signed)
08/17/2018:Screening mammogram [facility] detected distortion in the right breast 12 o'clock position. Biopsy revealed intermediate grade DCIS with calcifications, ER 100%, PR 100%, Tis NX stage 0  09/22/2018:Left lumpectomy (Dr. Dalbert Batman): DCIS, 1.5cm, intermediate grade, margins negative, ER 100%, PR 100% Tis NX stage 0  Recommendation: 1.  Adjuvant radiation therapy (delayed due to COVID-19) 2.  Other adjuvant antiestrogen therapy with tamoxifen x5 years.  Started 09/29/2018  Tamoxifen toxicities:  Return to clinic in 3 months for survivorship care plan visit

## 2018-12-28 ENCOUNTER — Telehealth: Payer: Self-pay | Admitting: Hematology and Oncology

## 2018-12-28 NOTE — Progress Notes (Signed)
  HEMATOLOGY-ONCOLOGY TELEPHONE VISIT PROGRESS NOTE  I connected with Carol Wood on 12/29/2018 at 11:15 AM EDT by telephone and verified that I am speaking with the correct person using two identifiers.  I discussed the limitations, risks, security and privacy concerns of performing an evaluation and management service by telephone and the availability of in person appointments.  I also discussed with the patient that there may be a patient responsible charge related to this service. The patient expressed understanding and agreed to proceed.   History of Present Illness: Carol Wood is a 73 y.o. female with above-mentioned history of right breast cancer treated with lumpectomy. She was concerned about coming to the clinic due to the pandemic and elected to undergo radiation therapy in [place]. She is currently on tamoxifen. She presents over the phone today for follow-up. Occ hot flashes.  Oncology History  Ductal carcinoma in situ (DCIS) of right breast  08/17/2018 Initial Diagnosis   Screening mammogram [facility] detected distortion in the right breast 12 o'clock position.  Biopsy revealed intermediate grade DCIS with calcifications, ER 100%, PR 100%, Tis NX stage 0   09/22/2018 Surgery   Left lumpectomy (Dr. Dalbert Batman): DCIS, 1.5cm, intermediate grade, margins negative, ER 100%, PR 100%.    09/29/2018 -  Anti-estrogen oral therapy   Tamoxifen, plan or 5 years.     Observations/Objective:  Ongoing XRT   Assessment Plan:  Ductal carcinoma in situ (DCIS) of right breast 08/17/2018:Screening mammogram [facility] detected distortion in the right breast 12 o'clock position. Biopsy revealed intermediate grade DCIS with calcifications, ER 100%, PR 100%, Tis NX stage 0  09/22/2018:Left lumpectomy (Dr. Dalbert Batman): DCIS, 1.5cm, intermediate grade, margins negative, ER 100%, PR 100% Tis NX stage 0  Recommendation: 1.  Adjuvant radiation therapy (delayed due to COVID-19) 2.  Other adjuvant  antiestrogen therapy with tamoxifen x5 years.  Started 09/29/2018  Tamoxifen toxicities: Occ hot flashes.  Return to clinic in 3 months for survivorship care plan visit   I discussed the assessment and treatment plan with the patient. The patient was provided an opportunity to ask questions and all were answered. The patient agreed with the plan and demonstrated an understanding of the instructions. The patient was advised to call back or seek an in-person evaluation if the symptoms worsen or if the condition fails to improve as anticipated.   I provided 15 minutes of non-face-to-face time during this encounter.   Rulon Eisenmenger, MD 12/29/2018    I, Molly Dorshimer, am acting as scribe for Nicholas Lose, MD.  I have reviewed the above documentation for accuracy and completeness, and I agree with the above.

## 2018-12-28 NOTE — Telephone Encounter (Signed)
Contacted patient to verify telephone visit for pre reg °

## 2018-12-29 ENCOUNTER — Inpatient Hospital Stay: Payer: Medicare Other | Attending: Hematology and Oncology | Admitting: Hematology and Oncology

## 2018-12-29 DIAGNOSIS — Z170 Estrogen receptor positive status [ER+]: Secondary | ICD-10-CM

## 2018-12-29 DIAGNOSIS — Z79810 Long term (current) use of selective estrogen receptor modulators (SERMs): Secondary | ICD-10-CM

## 2018-12-29 DIAGNOSIS — D0511 Intraductal carcinoma in situ of right breast: Secondary | ICD-10-CM | POA: Diagnosis not present

## 2019-01-02 ENCOUNTER — Telehealth: Payer: Self-pay | Admitting: Adult Health

## 2019-01-02 NOTE — Telephone Encounter (Signed)
I could not reach patient voicemail not set up

## 2019-01-03 ENCOUNTER — Encounter: Payer: Self-pay | Admitting: *Deleted

## 2019-03-22 ENCOUNTER — Telehealth: Payer: Self-pay | Admitting: Adult Health

## 2019-03-22 NOTE — Telephone Encounter (Signed)
I TALK with patient regarding phone visit

## 2019-04-10 ENCOUNTER — Telehealth: Payer: Self-pay | Admitting: Adult Health

## 2019-04-10 NOTE — Telephone Encounter (Signed)
Contacted patient to verify telephone visit for pre reg °

## 2019-04-11 ENCOUNTER — Encounter: Payer: Self-pay | Admitting: Adult Health

## 2019-04-11 ENCOUNTER — Inpatient Hospital Stay: Payer: Medicare Other | Attending: Hematology and Oncology | Admitting: Adult Health

## 2019-04-11 ENCOUNTER — Other Ambulatory Visit: Payer: Self-pay | Admitting: Adult Health

## 2019-04-11 DIAGNOSIS — D0511 Intraductal carcinoma in situ of right breast: Secondary | ICD-10-CM

## 2019-04-11 NOTE — Progress Notes (Signed)
SURVIVORSHIP VIRTUAL VISIT:  I connected with Carol Wood on 04/11/19 at 10:00 AM EDT by telphone and verified that I am speaking with the correct person using two identifiers.  I discussed the limitations, risks, security and privacy concerns of performing an evaluation and management service by telephone and the availability of in person appointments. I also discussed with the patient that there may be a patient responsible charge related to this service. The patient expressed understanding and agreed to proceed.   BRIEF ONCOLOGIC HISTORY:  Oncology History  Ductal carcinoma in situ (DCIS) of right breast  08/17/2018 Initial Diagnosis   Screening mammogram [facility] detected distortion in the right breast 12 o'clock position.  Biopsy revealed intermediate grade DCIS with calcifications, ER 100%, PR 100%, Tis NX stage 0   09/22/2018 Surgery   Left lumpectomy (Dr. Dalbert Batman): DCIS, 1.5cm, intermediate grade, margins negative, ER 100%, PR 100%.    09/22/2018 Cancer Staging   Staging form: Breast, AJCC 8th Edition - Pathologic stage from 09/22/2018: Stage 0 (pTis (DCIS), pN0, cM0, ER+, PR+) - Signed by Gardenia Phlegm, NP on 03/22/2019   09/29/2018 -  Anti-estrogen oral therapy   Tamoxifen, plan or 5 years.   12/05/2018 - 01/04/2019 Radiation Therapy   Adj RT at [facility] with Dr. Francesca Jewett: Right whole breast 3D CRT 6 and 18 MV 266 cGy 16 4256 cGy 12/05/2018   12/27/2018  Lumpectomy boost 3D CRT 6 MV 200 cGy 5 1000 cGy 12/28/2018 01/04/2019      INTERVAL HISTORY:  Carol Wood to review her survivorship care plan detailing her treatment course for breast cancer, as well as monitoring long-term side effects of that treatment, education regarding health maintenance, screening, and overall wellness and health promotion.     Overall, Carol Wood reports feeling moderately well.  She did experience increased fatigue.  This is slowly improving.  She is taking Tamoxifen daily.  She says she experiences hot  flashes--typically one hour after taking Tamoxifen, then 1-2 times in the middle of the night, however improving slightly, mood swings (now improved).  She denies any vaginal wetness/dryness, or vaginal bleeding.  She has some urinary frequency/urgency, and is going to have a physical next week with labs and hopes to have her urine checked at this time.  She denies any arthralgias at this point.  She is doing more, but is working on regaining her strength.    REVIEW OF SYSTEMS:  Review of Systems  Constitutional: Negative for appetite change, chills, fatigue, fever and unexpected weight change.  HENT:   Negative for hearing loss, lump/mass, sore throat and trouble swallowing.   Eyes: Negative for eye problems and icterus.  Respiratory: Negative for chest tightness, cough and shortness of breath.   Cardiovascular: Negative for chest pain, leg swelling and palpitations.  Gastrointestinal: Negative for abdominal distention, abdominal pain, constipation, diarrhea, nausea and vomiting.  Endocrine: Negative for hot flashes.  Genitourinary: Negative for difficulty urinating.   Musculoskeletal: Negative for arthralgias.  Skin: Negative for itching and rash.  Neurological: Negative for dizziness, extremity weakness, headaches and numbness.  Hematological: Negative for adenopathy. Does not bruise/bleed easily.  Psychiatric/Behavioral: Negative for depression. The patient is not nervous/anxious.   Breast: Denies any new nodularity, masses, tenderness, nipple changes, or nipple discharge.      ONCOLOGY TREATMENT TEAM:  1. Surgeon:  Dr. Dalbert Batman at [facility] 2. Medical Oncologist: Dr. Lindi Adie  3. Radiation Oncologist: Dr. Francesca Jewett    PAST MEDICAL/SURGICAL HISTORY:  Past Medical History:  Diagnosis  Date  . Anemia   . Cancer (HCC)    Skin  . Depressive disorder, not elsewhere classified   . History of hiatal hernia   . Hypothyroidism   . Pneumonia   . Pure hypercholesterolemia    . TIA (transient ischemic attack)   . Type II or unspecified type diabetes mellitus without mention of complication, not stated as uncontrolled   . Unspecified essential hypertension    Past Surgical History:  Procedure Laterality Date  . BACK SURGERY    . BREAST LUMPECTOMY WITH RADIOACTIVE SEED LOCALIZATION Right 09/22/2018   Procedure: RIGHT BREAST LUMPECTOMY WITH RADIOACTIVE SEED LOCALIZATION;  Surgeon: Fanny Skates, MD;  Location: [facility];  Service: General;  Laterality: Right;  . CATARACT EXTRACTION, BILATERAL     Laser  . COLONOSCOPY    . COLONOSCOPY N/A 07/31/2015   Procedure: COLONOSCOPY;  Surgeon: Rogene Houston, MD;  [facility];  Service: Endoscopy;  Laterality: N/A;  1200 - moved to 2/1 @ 10:30  . ESOPHAGEAL DILATION N/A 09/06/2014   Procedure: ESOPHAGEAL DILATION;  Surgeon: Rogene Houston, MD;  [facility];  Service: Endoscopy;  Laterality: N/A;  . ESOPHAGOGASTRODUODENOSCOPY N/A 09/06/2014   Procedure: ESOPHAGOGASTRODUODENOSCOPY (EGD);  Surgeon: Rogene Houston, MD;  [facility];  Service: Endoscopy;  Laterality: N/A;  220  . EYE SURGERY    . TUBAL LIGATION       ALLERGIES:  No Known Allergies   CURRENT MEDICATIONS:  Outpatient Encounter Medications as of 04/11/2019  Medication Sig  . ACCU-CHEK AVIVA PLUS test strip   . ACCU-CHEK SOFTCLIX LANCETS lancets   . aspirin 325 MG tablet Take 325 mg by mouth daily.  Marland Kitchen b complex vitamins tablet Take 1 tablet by mouth daily.  . Cholecalciferol (VITAMIN D) 50 MCG (2000 UT) CAPS Take 2,000 Units by mouth 2 (two) times daily.  . Flaxseed, Linseed, (FLAXSEED OIL) 1000 MG CAPS Take 1,000 mg by mouth daily.  Marland Kitchen glipiZIDE (GLUCOTROL XL) 10 MG 24 hr tablet Take 10 mg by mouth 2 (two) times daily.  Marland Kitchen glucosamine-chondroitin 500-400 MG tablet Take 1 tablet by mouth 2 (two) times daily.  Marland Kitchen HYDROcodone-acetaminophen (NORCO) 5-325 MG tablet Take 1-2 tablets by mouth every 6 (six) hours as needed for  moderate pain or severe pain.  Marland Kitchen insulin glargine (LANTUS) 100 UNIT/ML injection Inject 15 Units into the skin at bedtime.   Marland Kitchen levothyroxine (SYNTHROID, LEVOTHROID) 75 MCG tablet Take 75 mcg by mouth daily before breakfast.   . lisinopril (PRINIVIL,ZESTRIL) 20 MG tablet Take 20 mg by mouth 2 (two) times daily.   . Magnesium 250 MG TABS Take 250 mg by mouth daily.  . metoprolol succinate (TOPROL-XL) 25 MG 24 hr tablet Take 25 mg by mouth at bedtime.   . Multiple Vitamin (MULTIVITAMIN WITH MINERALS) TABS tablet Take 1 tablet by mouth daily.  . Multiple Vitamins-Minerals (VITEYES AREDS FORMULA PO) Take 1 tablet by mouth 2 (two) times daily.  . pantoprazole (PROTONIX) 40 MG tablet Take 40 mg by mouth at bedtime.   . tamoxifen (NOLVADEX) 20 MG tablet Take 1 tablet (20 mg total) by mouth daily.  . vitamin C (ASCORBIC ACID) 500 MG tablet Take 500 mg by mouth daily.   No facility-administered encounter medications on file as of 04/11/2019.      ONCOLOGIC FAMILY HISTORY:  Family History  Problem Relation Age of Onset  . Cancer Mother 39       Lung  . Colon cancer Brother   .  Heart disease Son 46       CABG  . Heart disease Son 55       CABG     GENETIC COUNSELING/TESTING: Not at this time  SOCIAL HISTORY:  Social History   Socioeconomic History  . Marital status: Widowed    Spouse name: Not on file  . Number of children: 3  . Years of education: Not on file  . Highest education level: Not on file  Occupational History  . Not on file  Social Needs  . Financial resource strain: Not on file  . Food insecurity    Worry: Not on file    Inability: Not on file  . Transportation needs    Medical: No    Non-medical: No  Tobacco Use  . Smoking status: Never Smoker  . Smokeless tobacco: Never Used  Substance and Sexual Activity  . Alcohol use: No    Alcohol/week: 0.0 standard drinks  . Drug use: No  . Sexual activity: Not on file  Lifestyle  . Physical activity    Days per  week: Not on file    Minutes per session: Not on file  . Stress: Not on file  Relationships  . Social Herbalist on phone: Not on file    Gets together: Not on file    Attends religious service: Not on file    Active member of club or organization: Not on file    Attends meetings of clubs or organizations: Not on file    Relationship status: Not on file  . Intimate partner violence    Fear of current or ex partner: Not on file    Emotionally abused: Not on file    Physically abused: Not on file    Forced sexual activity: Not on file  Other Topics Concern  . Not on file  Social History Narrative   Lives alone.  Widow.  Retired Marine scientist.     OBSERVATIONS/OBJECTIVE:  Patient sounds well.  In no apparent distress.  Mood and behavior are normal.  Breathing is non labored.    LABORATORY DATA:  None for this visit.  DIAGNOSTIC IMAGING:  None for this visit.      ASSESSMENT AND PLAN:  Carol Wood is a pleasant 73 y.o. female with Stage 0 right breast DCIS, ER+/PR+, diagnosed in 07/2018, treated with lumpectomy, adjuvant radiation therapy, and anti-estrogen therapy with Tamoxifen beginning in 09/2018.  She presents to the Survivorship Clinic for our initial meeting and routine follow-up post-completion of treatment for breast cancer.    1. Stage 0 right breast cancer:  Carol Wood is continuing to recover from definitive treatment for breast cancer. She will follow-up with her medical oncologist, Dr. Lindi Adie in 10/2018 with history and physical exam per surveillance protocol.  She will continue her anti-estrogen therapy with Tamoxifen. Thus far, she is tolerating the Tamoxifen well, with minimal side effects. She was instructed to make Dr. Lindi Adie or myself aware if she begins to experience any worsening side effects of the medication and I could see her back in clinic to help manage those side effects, as needed. Her mammogram is due 07/2019; orders placed today.  She wants to have this  done in [place], and my nurse Cecille Rubin is sending orders there. Today, a comprehensive survivorship care plan and treatment summary was reviewed with the patient today detailing her breast cancer diagnosis, treatment course, potential late/long-term effects of treatment, appropriate follow-up care with recommendations for the future, and patient  education resources.  A copy of this summary, along with a letter will be sent to the patient's primary care provider via mail/fax/In Basket message after today's visit.    2. Bone health:  Given Carol Wood's age/history of breast cancer, she is at risk for bone demineralization. I counseled her that tamoxifen helps increase her bone density.   She was given education on specific activities to promote bone health.  3. Cancer screening:  Due to Carol Wood history and her age, she should receive screening for skin cancers, colon cancer, and gynecologic cancers.  The information and recommendations are listed on the patient's comprehensive care plan/treatment summary and were reviewed in detail with the patient.    4. Health maintenance and wellness promotion: Carol Wood was encouraged to consume 5-7 servings of fruits and vegetables per day. We reviewed the "Nutrition Rainbow" handout, as well as the handout "Take Control of Your Health and Reduce Your Cancer Risk" from the [employer].  She was also encouraged to engage in moderate to vigorous exercise for 30 minutes per day most days of the week. We discussed the [employer] fitness program, which is designed for cancer survivors to help them become more physically fit after cancer treatments.  She was instructed to limit her alcohol consumption and continue to abstain from tobacco use.     5. Support services/counseling: It is not uncommon for this period of the patient's cancer care trajectory to be one of many emotions and stressors.  We discussed how this can be increasingly difficult during the times of  quarantine and social distancing due to the COVID-19 pandemic.   She was given information regarding our available services and encouraged to contact me with any questions or for help enrolling in any of our support group/programs.    Follow up instructions:    -Return to cancer center in 10/2019 for f/u with Dr. Lindi Adie  -Mammogram due in 07/2019 -Follow up with Dr. Dalbert Batman in 04/2019 -She is welcome to return back to the Survivorship Clinic at any time; no additional follow-up needed at this time.  -Consider referral back to survivorship as a long-term survivor for continued surveillance  The patient was provided an opportunity to ask questions and all were answered. The patient agreed with the plan and demonstrated an understanding of the instructions.   The patient was advised to call back or seek an in-person evaluation if the symptoms worsen or if the condition fails to improve as anticipated.   I provided 25 minutes of non face-to-face telephone visit time during this encounter, and > 50% was spent counseling as documented under my assessment & plan.  Scot Dock, NP

## 2019-04-12 ENCOUNTER — Telehealth: Payer: Self-pay | Admitting: Adult Health

## 2019-04-12 IMAGING — MG MM BREAST SURGICAL SPECIMEN
1 series · 1 of 1 positions shown · non-contrast
Comparison: Previous exam(s).

CLINICAL DATA: Radioactive seed localization was performed prior to
lumpectomy.

EXAM:
SPECIMEN RADIOGRAPH OF THE RIGHT BREAST

[R]
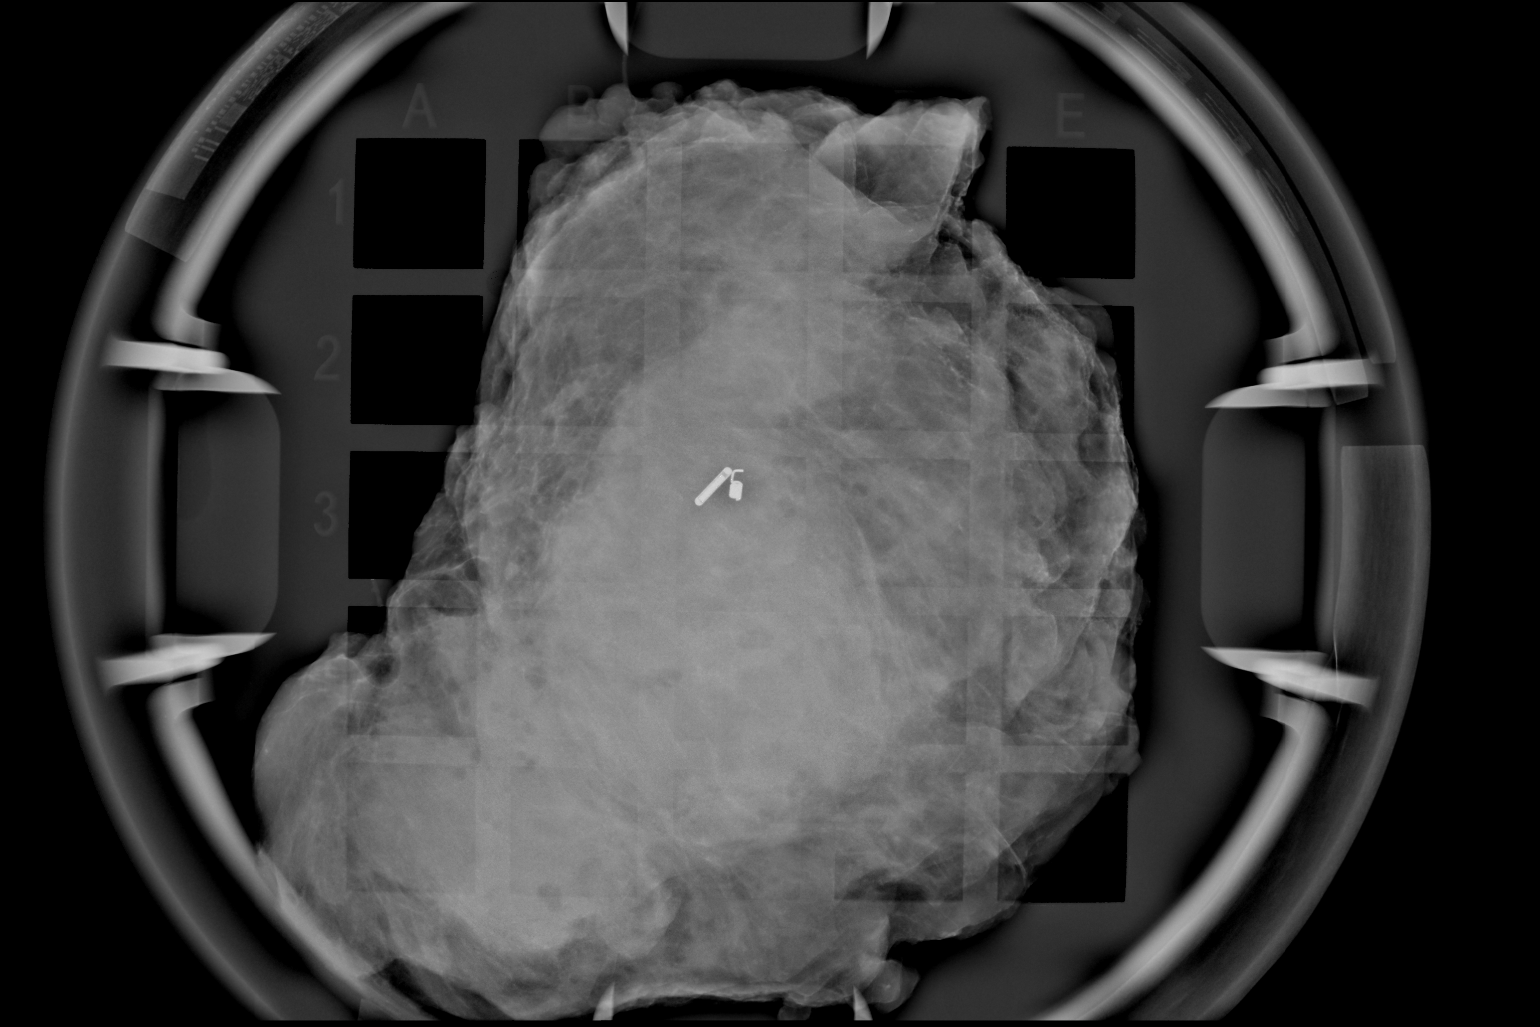

[1 of 1 positions shown; findings below may reference images not displayed]

FINDINGS: Status post excision of the right breast. The radioactive seed and
biopsy marker clip are present, completely intact, and were marked
for pathology.
IMPRESSION: Specimen radiograph of the right breast.

## 2019-04-12 NOTE — Telephone Encounter (Signed)
I talk with patient regarding schedule  

## 2019-07-06 DIAGNOSIS — C50919 Malignant neoplasm of unspecified site of unspecified female breast: Secondary | ICD-10-CM | POA: Diagnosis not present

## 2019-07-06 DIAGNOSIS — I10 Essential (primary) hypertension: Secondary | ICD-10-CM | POA: Diagnosis not present

## 2019-07-06 DIAGNOSIS — Z299 Encounter for prophylactic measures, unspecified: Secondary | ICD-10-CM | POA: Diagnosis not present

## 2019-07-06 DIAGNOSIS — H3530 Unspecified macular degeneration: Secondary | ICD-10-CM | POA: Diagnosis not present

## 2019-07-06 DIAGNOSIS — E1165 Type 2 diabetes mellitus with hyperglycemia: Secondary | ICD-10-CM | POA: Diagnosis not present

## 2019-07-11 ENCOUNTER — Other Ambulatory Visit: Payer: Self-pay | Admitting: Hematology and Oncology

## 2019-07-27 DIAGNOSIS — E1169 Type 2 diabetes mellitus with other specified complication: Secondary | ICD-10-CM | POA: Diagnosis not present

## 2019-08-09 DIAGNOSIS — R928 Other abnormal and inconclusive findings on diagnostic imaging of breast: Secondary | ICD-10-CM | POA: Diagnosis not present

## 2019-08-09 DIAGNOSIS — D0511 Intraductal carcinoma in situ of right breast: Secondary | ICD-10-CM | POA: Diagnosis not present

## 2019-08-27 DIAGNOSIS — E1169 Type 2 diabetes mellitus with other specified complication: Secondary | ICD-10-CM | POA: Diagnosis not present

## 2019-09-26 DIAGNOSIS — E1169 Type 2 diabetes mellitus with other specified complication: Secondary | ICD-10-CM | POA: Diagnosis not present

## 2019-09-30 ENCOUNTER — Other Ambulatory Visit: Payer: Self-pay | Admitting: Hematology and Oncology

## 2019-10-03 DIAGNOSIS — D0511 Intraductal carcinoma in situ of right breast: Secondary | ICD-10-CM | POA: Diagnosis not present

## 2019-10-03 DIAGNOSIS — Z9189 Other specified personal risk factors, not elsewhere classified: Secondary | ICD-10-CM | POA: Diagnosis not present

## 2019-10-05 DIAGNOSIS — H40013 Open angle with borderline findings, low risk, bilateral: Secondary | ICD-10-CM | POA: Diagnosis not present

## 2019-10-05 DIAGNOSIS — H43813 Vitreous degeneration, bilateral: Secondary | ICD-10-CM | POA: Diagnosis not present

## 2019-10-05 DIAGNOSIS — Z961 Presence of intraocular lens: Secondary | ICD-10-CM | POA: Diagnosis not present

## 2019-10-05 DIAGNOSIS — H35013 Changes in retinal vascular appearance, bilateral: Secondary | ICD-10-CM | POA: Diagnosis not present

## 2019-10-12 DIAGNOSIS — E1165 Type 2 diabetes mellitus with hyperglycemia: Secondary | ICD-10-CM | POA: Diagnosis not present

## 2019-10-12 DIAGNOSIS — Z299 Encounter for prophylactic measures, unspecified: Secondary | ICD-10-CM | POA: Diagnosis not present

## 2019-10-12 DIAGNOSIS — I10 Essential (primary) hypertension: Secondary | ICD-10-CM | POA: Diagnosis not present

## 2019-10-12 DIAGNOSIS — E039 Hypothyroidism, unspecified: Secondary | ICD-10-CM | POA: Diagnosis not present

## 2019-10-12 DIAGNOSIS — K219 Gastro-esophageal reflux disease without esophagitis: Secondary | ICD-10-CM | POA: Diagnosis not present

## 2019-10-27 DIAGNOSIS — E1169 Type 2 diabetes mellitus with other specified complication: Secondary | ICD-10-CM | POA: Diagnosis not present

## 2019-11-08 NOTE — Progress Notes (Signed)
Patient Care Team: Glenda Chroman, MD as PCP - General (Internal Medicine) Jacelyn Pi, MD as Attending Physician (Endocrinology) Meda Klinefelter, MD as Referring Physician (Radiation Oncology) Nicholas Lose, MD as Consulting Physician (Hematology and Oncology) Fanny Skates, MD as Consulting Physician (General Surgery)  DIAGNOSIS:    ICD-10-CM   1. Ductal carcinoma in situ (DCIS) of right breast  D05.11     SUMMARY OF ONCOLOGIC HISTORY: Oncology History  Ductal carcinoma in situ (DCIS) of right breast  08/17/2018 Initial Diagnosis   Screening mammogram [facility] detected distortion in the right breast 12 o'clock position.  Biopsy revealed intermediate grade DCIS with calcifications, ER 100%, PR 100%, Tis NX stage 0   09/22/2018 Surgery   Left lumpectomy (Dr. Dalbert Batman): DCIS, 1.5cm, intermediate grade, margins negative, ER 100%, PR 100%.    09/22/2018 Cancer Staging   Staging form: Breast, AJCC 8th Edition - Pathologic stage from 09/22/2018: Stage 0 (pTis (DCIS), pN0, cM0, ER+, PR+) - Signed by Gardenia Phlegm, NP on 03/22/2019   09/29/2018 -  Anti-estrogen oral therapy   Tamoxifen, plan or 5 years.   12/05/2018 - 01/04/2019 Radiation Therapy   Adj RT at [facility] with Dr. Francesca Jewett: Right whole breast 3D CRT 6 and 18 MV 266 cGy 16 4256 cGy 12/05/2018   12/27/2018  Lumpectomy boost 3D CRT 6 MV 200 cGy 5 1000 cGy 12/28/2018 01/04/2019      CHIEF COMPLIANT: Follow-up of right breast cancer on tamoxifen  INTERVAL HISTORY: Carol Wood is a 74 y.o. with above-mentioned history of right breast cancer treated with lumpectomy, radiation, and is currently on tamoxifen. She presents to the clinic today for follow-up.  ALLERGIES:  has No Known Allergies.  MEDICATIONS:  Current Outpatient Medications  Medication Sig Dispense Refill  . ACCU-CHEK AVIVA PLUS test strip     . ACCU-CHEK SOFTCLIX LANCETS lancets     . aspirin 325 MG tablet Take 325 mg by mouth daily.    Marland Kitchen b  complex vitamins tablet Take 1 tablet by mouth daily.    . Cholecalciferol (VITAMIN D) 50 MCG (2000 UT) CAPS Take 2,000 Units by mouth 2 (two) times daily.    . Flaxseed, Linseed, (FLAXSEED OIL) 1000 MG CAPS Take 1,000 mg by mouth daily.    Marland Kitchen glipiZIDE (GLUCOTROL XL) 10 MG 24 hr tablet Take 10 mg by mouth 2 (two) times daily.    Marland Kitchen glucosamine-chondroitin 500-400 MG tablet Take 1 tablet by mouth 2 (two) times daily.    Marland Kitchen HYDROcodone-acetaminophen (NORCO) 5-325 MG tablet Take 1-2 tablets by mouth every 6 (six) hours as needed for moderate pain or severe pain. 20 tablet 0  . insulin glargine (LANTUS) 100 UNIT/ML injection Inject 15 Units into the skin at bedtime.     Marland Kitchen levothyroxine (SYNTHROID, LEVOTHROID) 75 MCG tablet Take 75 mcg by mouth daily before breakfast.     . lisinopril (PRINIVIL,ZESTRIL) 20 MG tablet Take 20 mg by mouth 2 (two) times daily.     . Magnesium 250 MG TABS Take 250 mg by mouth daily.    . metoprolol succinate (TOPROL-XL) 25 MG 24 hr tablet Take 25 mg by mouth at bedtime.     . Multiple Vitamin (MULTIVITAMIN WITH MINERALS) TABS tablet Take 1 tablet by mouth daily.    . Multiple Vitamins-Minerals (VITEYES AREDS FORMULA PO) Take 1 tablet by mouth 2 (two) times daily.    . pantoprazole (PROTONIX) 40 MG tablet Take 40 mg by mouth at bedtime.     Marland Kitchen  tamoxifen (NOLVADEX) 20 MG tablet TAKE 1 TABLET BY MOUTH  DAILY 90 tablet 3  . vitamin C (ASCORBIC ACID) 500 MG tablet Take 500 mg by mouth daily.     No current facility-administered medications for this visit.    PHYSICAL EXAMINATION: ECOG PERFORMANCE STATUS: 1 - Symptomatic but completely ambulatory  Vitals:   11/09/19 0957  BP: (!) 155/75  Pulse: 73  Resp: 19  Temp: 98.3 F (36.8 C)  SpO2: 100%   Filed Weights   11/09/19 0957  Weight: 168 lb 9.6 oz (76.5 kg)    BREAST: No palpable masses or nodules in either right or left breasts. No palpable axillary supraclavicular or infraclavicular adenopathy no breast tenderness  or nipple discharge. (exam performed in the presence of a chaperone)  LABORATORY DATA:  I have reviewed the data as listed CMP Latest Ref Rng & Units 09/14/2018  Glucose 70 - 99 mg/dL 179(H)  BUN 8 - 23 mg/dL 14  Creatinine 0.44 - 1.00 mg/dL 0.86  Sodium 135 - 145 mmol/L 139  Potassium 3.5 - 5.1 mmol/L 4.6  Chloride 98 - 111 mmol/L 106  CO2 22 - 32 mmol/L 23  Calcium 8.9 - 10.3 mg/dL 9.8  Total Protein 6.5 - 8.1 g/dL 7.5  Total Bilirubin 0.3 - 1.2 mg/dL 0.4  Alkaline Phos 38 - 126 U/L 79  AST 15 - 41 U/L 26  ALT 0 - 44 U/L 28    Lab Results  Component Value Date   WBC 7.9 09/14/2018   HGB 14.4 09/14/2018   HCT 47.1 (H) 09/14/2018   MCV 94.6 09/14/2018   PLT 364 09/14/2018   NEUTROABS 4.2 09/14/2018    ASSESSMENT & PLAN:  Ductal carcinoma in situ (DCIS) of right breast 08/17/2018:Screening mammogram [facility] detected distortion in the right breast 12 o'clock position. Biopsy revealed intermediate grade DCIS with calcifications, ER 100%, PR 100%, Tis NX stage 0  09/22/2018:Left lumpectomy (Dr. Dalbert Batman): DCIS, 1.5cm, intermediate grade, margins negative, ER 100%, PR 100%Tis NX stage 0  Recommendation: 1.Adjuvant radiation therapy (delayed due to COVID-19) 2.Other adjuvant antiestrogen therapy with tamoxifen x5 years.  Started 09/29/2018  Tamoxifen toxicities: Occ hot flashes. Mood swings and: Her son had passed away from an acute MI and she is still grieving from his loss. She does not want any medications to help go through this.  Breast cancer surveillance: 1.  Breast exam 11/09/2019: Benign 2.  Mammograms February 2021: Benign  Return to clinic in 1 year for follow up    No orders of the defined types were placed in this encounter.  The patient has a good understanding of the overall plan. she agrees with it. she will call with any problems that may develop before the next visit here.  Total time spent: 20 mins including face to face time and time  spent for planning, charting and coordination of care  Nicholas Lose, MD 11/09/2019  I, Cloyde Reams Dorshimer, am acting as scribe for Dr. Nicholas Lose.  I have reviewed the above documentation for accuracy and completeness, and I agree with the above.

## 2019-11-09 ENCOUNTER — Other Ambulatory Visit: Payer: Self-pay

## 2019-11-09 ENCOUNTER — Inpatient Hospital Stay: Payer: Medicare Other | Attending: Hematology and Oncology | Admitting: Hematology and Oncology

## 2019-11-09 DIAGNOSIS — D0511 Intraductal carcinoma in situ of right breast: Secondary | ICD-10-CM | POA: Diagnosis not present

## 2019-11-09 DIAGNOSIS — Z923 Personal history of irradiation: Secondary | ICD-10-CM | POA: Insufficient documentation

## 2019-11-09 DIAGNOSIS — Z79810 Long term (current) use of selective estrogen receptor modulators (SERMs): Secondary | ICD-10-CM | POA: Diagnosis not present

## 2019-11-09 NOTE — Assessment & Plan Note (Signed)
08/17/2018:Screening mammogram [facility] detected distortion in the right breast 12 o'clock position. Biopsy revealed intermediate grade DCIS with calcifications, ER 100%, PR 100%, Tis NX stage 0  09/22/2018:Left lumpectomy (Dr. Dalbert Batman): DCIS, 1.5cm, intermediate grade, margins negative, ER 100%, PR 100%Tis NX stage 0  Recommendation: 1.Adjuvant radiation therapy (delayed due to COVID-19) 2.Other adjuvant antiestrogen therapy with tamoxifen x5 years.  Started 09/29/2018  Tamoxifen toxicities: Occ hot flashes.  Return to clinic in 1 year for follow up

## 2019-11-13 ENCOUNTER — Telehealth: Payer: Self-pay | Admitting: Hematology and Oncology

## 2019-11-13 NOTE — Telephone Encounter (Signed)
Scheduled per los, patient has been called and notified. 

## 2019-11-26 DIAGNOSIS — E1169 Type 2 diabetes mellitus with other specified complication: Secondary | ICD-10-CM | POA: Diagnosis not present

## 2019-12-27 DIAGNOSIS — E1169 Type 2 diabetes mellitus with other specified complication: Secondary | ICD-10-CM | POA: Diagnosis not present

## 2020-01-18 DIAGNOSIS — N1831 Chronic kidney disease, stage 3a: Secondary | ICD-10-CM | POA: Diagnosis not present

## 2020-01-18 DIAGNOSIS — Z299 Encounter for prophylactic measures, unspecified: Secondary | ICD-10-CM | POA: Diagnosis not present

## 2020-01-18 DIAGNOSIS — E1165 Type 2 diabetes mellitus with hyperglycemia: Secondary | ICD-10-CM | POA: Diagnosis not present

## 2020-01-18 DIAGNOSIS — C50919 Malignant neoplasm of unspecified site of unspecified female breast: Secondary | ICD-10-CM | POA: Diagnosis not present

## 2020-01-18 DIAGNOSIS — E039 Hypothyroidism, unspecified: Secondary | ICD-10-CM | POA: Diagnosis not present

## 2020-01-18 DIAGNOSIS — I10 Essential (primary) hypertension: Secondary | ICD-10-CM | POA: Diagnosis not present

## 2020-01-26 DIAGNOSIS — E1169 Type 2 diabetes mellitus with other specified complication: Secondary | ICD-10-CM | POA: Diagnosis not present

## 2020-02-27 DIAGNOSIS — E1169 Type 2 diabetes mellitus with other specified complication: Secondary | ICD-10-CM | POA: Diagnosis not present

## 2020-03-28 DIAGNOSIS — E1169 Type 2 diabetes mellitus with other specified complication: Secondary | ICD-10-CM | POA: Diagnosis not present

## 2020-04-24 DIAGNOSIS — Z7189 Other specified counseling: Secondary | ICD-10-CM | POA: Diagnosis not present

## 2020-04-24 DIAGNOSIS — Z0000 Encounter for general adult medical examination without abnormal findings: Secondary | ICD-10-CM | POA: Diagnosis not present

## 2020-04-24 DIAGNOSIS — I10 Essential (primary) hypertension: Secondary | ICD-10-CM | POA: Diagnosis not present

## 2020-04-24 DIAGNOSIS — E039 Hypothyroidism, unspecified: Secondary | ICD-10-CM | POA: Diagnosis not present

## 2020-04-24 DIAGNOSIS — Z79899 Other long term (current) drug therapy: Secondary | ICD-10-CM | POA: Diagnosis not present

## 2020-04-24 DIAGNOSIS — Z299 Encounter for prophylactic measures, unspecified: Secondary | ICD-10-CM | POA: Diagnosis not present

## 2020-04-24 DIAGNOSIS — R5383 Other fatigue: Secondary | ICD-10-CM | POA: Diagnosis not present

## 2020-04-24 DIAGNOSIS — E7800 Pure hypercholesterolemia, unspecified: Secondary | ICD-10-CM | POA: Diagnosis not present

## 2020-04-25 ENCOUNTER — Ambulatory Visit: Payer: Medicare Other | Attending: Internal Medicine

## 2020-04-25 DIAGNOSIS — H40013 Open angle with borderline findings, low risk, bilateral: Secondary | ICD-10-CM | POA: Diagnosis not present

## 2020-04-25 DIAGNOSIS — Z23 Encounter for immunization: Secondary | ICD-10-CM

## 2020-04-25 NOTE — Progress Notes (Signed)
   Covid-19 Vaccination Clinic  Name:  Carol Wood    MRN: [id number] DOB: 03-11-1946  04/25/2020  [name] was observed post Covid-19 immunization for 15 minutes without incident. She was provided with Vaccine Information Sheet and instruction to access the V-Safe system.   [name] was instructed to call 911 with any severe reactions post vaccine: Marland Kitchen Difficulty breathing  . Swelling of face and throat  . A fast heartbeat  . A bad rash all over body  . Dizziness and weakness

## 2020-04-27 DIAGNOSIS — E1169 Type 2 diabetes mellitus with other specified complication: Secondary | ICD-10-CM | POA: Diagnosis not present

## 2020-05-28 DIAGNOSIS — E1169 Type 2 diabetes mellitus with other specified complication: Secondary | ICD-10-CM | POA: Diagnosis not present

## 2020-05-29 DIAGNOSIS — I779 Disorder of arteries and arterioles, unspecified: Secondary | ICD-10-CM | POA: Diagnosis not present

## 2020-05-29 DIAGNOSIS — E1165 Type 2 diabetes mellitus with hyperglycemia: Secondary | ICD-10-CM | POA: Diagnosis not present

## 2020-05-29 DIAGNOSIS — Z299 Encounter for prophylactic measures, unspecified: Secondary | ICD-10-CM | POA: Diagnosis not present

## 2020-05-29 DIAGNOSIS — I10 Essential (primary) hypertension: Secondary | ICD-10-CM | POA: Diagnosis not present

## 2020-05-29 DIAGNOSIS — C50919 Malignant neoplasm of unspecified site of unspecified female breast: Secondary | ICD-10-CM | POA: Diagnosis not present

## 2020-06-27 DIAGNOSIS — E1169 Type 2 diabetes mellitus with other specified complication: Secondary | ICD-10-CM | POA: Diagnosis not present

## 2020-07-24 ENCOUNTER — Encounter (INDEPENDENT_AMBULATORY_CARE_PROVIDER_SITE_OTHER): Payer: Self-pay | Admitting: *Deleted

## 2020-07-29 DIAGNOSIS — E1169 Type 2 diabetes mellitus with other specified complication: Secondary | ICD-10-CM | POA: Diagnosis not present

## 2020-08-26 DIAGNOSIS — E1169 Type 2 diabetes mellitus with other specified complication: Secondary | ICD-10-CM | POA: Diagnosis not present

## 2020-08-28 DIAGNOSIS — Z9889 Other specified postprocedural states: Secondary | ICD-10-CM | POA: Diagnosis not present

## 2020-08-28 DIAGNOSIS — R922 Inconclusive mammogram: Secondary | ICD-10-CM | POA: Diagnosis not present

## 2020-08-28 DIAGNOSIS — Z853 Personal history of malignant neoplasm of breast: Secondary | ICD-10-CM | POA: Diagnosis not present

## 2020-09-10 DIAGNOSIS — I10 Essential (primary) hypertension: Secondary | ICD-10-CM | POA: Diagnosis not present

## 2020-09-10 DIAGNOSIS — E1122 Type 2 diabetes mellitus with diabetic chronic kidney disease: Secondary | ICD-10-CM | POA: Diagnosis not present

## 2020-09-10 DIAGNOSIS — N183 Chronic kidney disease, stage 3 unspecified: Secondary | ICD-10-CM | POA: Diagnosis not present

## 2020-09-10 DIAGNOSIS — E1165 Type 2 diabetes mellitus with hyperglycemia: Secondary | ICD-10-CM | POA: Diagnosis not present

## 2020-09-10 DIAGNOSIS — I779 Disorder of arteries and arterioles, unspecified: Secondary | ICD-10-CM | POA: Diagnosis not present

## 2020-09-10 DIAGNOSIS — Z299 Encounter for prophylactic measures, unspecified: Secondary | ICD-10-CM | POA: Diagnosis not present

## 2020-09-19 ENCOUNTER — Telehealth (INDEPENDENT_AMBULATORY_CARE_PROVIDER_SITE_OTHER): Payer: Self-pay | Admitting: Internal Medicine

## 2020-09-19 NOTE — Telephone Encounter (Signed)
Patient left message stating she mailed her questionnaire back in January 2022 - states she hasn't heard anything about scheduling - please advise - ph# [phone]

## 2020-09-20 NOTE — Telephone Encounter (Signed)
I do, I will call her Monday to schedule.Marland Kitchen

## 2020-09-20 NOTE — Telephone Encounter (Signed)
Carol Frohlich do you have this one?

## 2020-09-20 NOTE — Telephone Encounter (Signed)
noted 

## 2020-09-23 ENCOUNTER — Telehealth (INDEPENDENT_AMBULATORY_CARE_PROVIDER_SITE_OTHER): Payer: Self-pay | Admitting: *Deleted

## 2020-09-23 ENCOUNTER — Encounter (INDEPENDENT_AMBULATORY_CARE_PROVIDER_SITE_OTHER): Payer: Self-pay | Admitting: *Deleted

## 2020-09-23 NOTE — Telephone Encounter (Signed)
Referring MD/PCP: vyas   Procedure: tcs/egd  Reason/Indication:  Hx polyps, fam hx colon ca, GERD, fam hx stomach cancer, hiatal hernia  Has patient had this procedure before?  Yes, 2017  If so, when, by whom and where?    Is there a family history of colon cancer?  Yes, brother  Who?  What age when diagnosed?    Is patient diabetic?   yes      Does patient have prosthetic heart valve or mechanical valve?  no  Do you have a pacemaker/defibrillator?  no  Has patient ever had endocarditis/atrial fibrillation? no  Have you had a stroke/heart attack last 6 mths? no  Does patient use oxygen? no  Has patient had joint replacement within last 12 months?  no  Is patient constipated or do they take laxatives? no  Does patient have a history of alcohol/drug use?  no  Is patient on blood thinner such as Coumadin, Plavix and/or Aspirin? yes  Do you take medicine for weight loss. no  Medications: asa 325 mg daily, lisinopril 20 mg bid, metoprolol 50 mg daily, lantus 25 units in morning, pantoprazole 40 mg daily, levothyroxine .075 mcg daily, glipizide 10 mg bid  Allergies: nkda  Medication Adjustment per Dr Rehman/Dr Jenetta Downer asa 2 days, hold oral DM evening before & morning of, hold insulin morning of  Procedure date & time: 10/10/20

## 2020-09-23 NOTE — Telephone Encounter (Signed)
Patient needs plenvu 

## 2020-09-24 ENCOUNTER — Other Ambulatory Visit (INDEPENDENT_AMBULATORY_CARE_PROVIDER_SITE_OTHER): Payer: Self-pay

## 2020-09-24 DIAGNOSIS — Z86010 Personal history of colonic polyps: Secondary | ICD-10-CM

## 2020-09-24 DIAGNOSIS — K449 Diaphragmatic hernia without obstruction or gangrene: Secondary | ICD-10-CM

## 2020-09-24 DIAGNOSIS — Z800 Family history of malignant neoplasm of digestive organs: Secondary | ICD-10-CM

## 2020-09-24 DIAGNOSIS — K219 Gastro-esophageal reflux disease without esophagitis: Secondary | ICD-10-CM

## 2020-09-24 MED ORDER — PLENVU 140 G PO SOLR: 1.0000 | Freq: Once | ORAL | 0 refills | Status: AC

## 2020-09-24 NOTE — Telephone Encounter (Signed)
Done

## 2020-09-25 ENCOUNTER — Other Ambulatory Visit (INDEPENDENT_AMBULATORY_CARE_PROVIDER_SITE_OTHER): Payer: Self-pay | Admitting: *Deleted

## 2020-09-26 DIAGNOSIS — E1169 Type 2 diabetes mellitus with other specified complication: Secondary | ICD-10-CM | POA: Diagnosis not present

## 2020-10-08 ENCOUNTER — Other Ambulatory Visit: Payer: Self-pay

## 2020-10-08 ENCOUNTER — Other Ambulatory Visit (HOSPITAL_COMMUNITY)
Admission: RE | Admit: 2020-10-08 | Discharge: 2020-10-08 | Disposition: A | Discharge status: Home / Self Care | Payer: Medicare Other | Source: Ambulatory Visit | Attending: Internal Medicine | Admitting: Internal Medicine

## 2020-10-08 DIAGNOSIS — Z01812 Encounter for preprocedural laboratory examination: Secondary | ICD-10-CM | POA: Insufficient documentation

## 2020-10-08 DIAGNOSIS — Z20822 Contact with and (suspected) exposure to covid-19: Secondary | ICD-10-CM | POA: Insufficient documentation

## 2020-10-08 LAB — SARS CORONAVIRUS 2 (TAT 6-24 HRS): SARS Coronavirus 2: NEGATIVE

## 2020-10-08 NOTE — Progress Notes (Signed)
Pt. Covid order was canceled in the system. I swabbed pt., created another order, released it and printed the requsition.

## 2020-10-10 ENCOUNTER — Encounter (HOSPITAL_COMMUNITY)
Admission: RE | Disposition: A | Discharge status: Home / Self Care | Payer: Self-pay | Source: Home / Self Care | Attending: Internal Medicine

## 2020-10-10 ENCOUNTER — Encounter (HOSPITAL_COMMUNITY): Payer: Self-pay | Admitting: Internal Medicine

## 2020-10-10 ENCOUNTER — Ambulatory Visit (HOSPITAL_COMMUNITY)
Admission: RE | Admit: 2020-10-10 | Discharge: 2020-10-10 | Disposition: A | Discharge status: Home / Self Care | Payer: Medicare Other | Attending: Internal Medicine | Admitting: Internal Medicine

## 2020-10-10 ENCOUNTER — Other Ambulatory Visit: Payer: Self-pay

## 2020-10-10 DIAGNOSIS — Z86010 Personal history of colonic polyps: Secondary | ICD-10-CM | POA: Diagnosis not present

## 2020-10-10 DIAGNOSIS — K219 Gastro-esophageal reflux disease without esophagitis: Secondary | ICD-10-CM | POA: Insufficient documentation

## 2020-10-10 DIAGNOSIS — Z800 Family history of malignant neoplasm of digestive organs: Secondary | ICD-10-CM | POA: Diagnosis not present

## 2020-10-10 DIAGNOSIS — D123 Benign neoplasm of transverse colon: Secondary | ICD-10-CM | POA: Insufficient documentation

## 2020-10-10 DIAGNOSIS — Z09 Encounter for follow-up examination after completed treatment for conditions other than malignant neoplasm: Secondary | ICD-10-CM | POA: Diagnosis not present

## 2020-10-10 DIAGNOSIS — K449 Diaphragmatic hernia without obstruction or gangrene: Secondary | ICD-10-CM | POA: Insufficient documentation

## 2020-10-10 DIAGNOSIS — Z79890 Hormone replacement therapy (postmenopausal): Secondary | ICD-10-CM | POA: Insufficient documentation

## 2020-10-10 DIAGNOSIS — Z79899 Other long term (current) drug therapy: Secondary | ICD-10-CM | POA: Diagnosis not present

## 2020-10-10 DIAGNOSIS — Z794 Long term (current) use of insulin: Secondary | ICD-10-CM | POA: Insufficient documentation

## 2020-10-10 DIAGNOSIS — Z8719 Personal history of other diseases of the digestive system: Secondary | ICD-10-CM | POA: Insufficient documentation

## 2020-10-10 DIAGNOSIS — Z1211 Encounter for screening for malignant neoplasm of colon: Secondary | ICD-10-CM | POA: Diagnosis not present

## 2020-10-10 DIAGNOSIS — K2289 Other specified disease of esophagus: Secondary | ICD-10-CM | POA: Diagnosis not present

## 2020-10-10 DIAGNOSIS — Z7982 Long term (current) use of aspirin: Secondary | ICD-10-CM | POA: Diagnosis not present

## 2020-10-10 DIAGNOSIS — K644 Residual hemorrhoidal skin tags: Secondary | ICD-10-CM | POA: Diagnosis not present

## 2020-10-10 HISTORY — PX: BIOPSY: SHX5522

## 2020-10-10 HISTORY — PX: POLYPECTOMY: SHX5525

## 2020-10-10 HISTORY — PX: COLONOSCOPY: SHX5424

## 2020-10-10 HISTORY — DX: Cerebral infarction, unspecified: I63.9

## 2020-10-10 HISTORY — PX: ESOPHAGOGASTRODUODENOSCOPY: SHX5428

## 2020-10-10 LAB — HM COLONOSCOPY

## 2020-10-10 LAB — GLUCOSE, CAPILLARY: Glucose-Capillary: 104 mg/dL — ABNORMAL HIGH (ref 70–99)

## 2020-10-10 SURGERY — COLONOSCOPY
Anesthesia: Moderate Sedation

## 2020-10-10 MED ORDER — MIDAZOLAM HCL 5 MG/5ML IJ SOLN
INTRAMUSCULAR | Status: DC | PRN
  Administered 2020-10-10 (×2): 1 mg via INTRAVENOUS
  Administered 2020-10-10 (×2): 2 mg via INTRAVENOUS

## 2020-10-10 MED ORDER — SODIUM CHLORIDE 0.9 % IV SOLN: INTRAVENOUS | Status: DC

## 2020-10-10 MED ORDER — LIDOCAINE VISCOUS HCL 2 % MT SOLN
OROMUCOSAL | Status: AC
  Filled 2020-10-10: qty 15

## 2020-10-10 MED ORDER — MEPERIDINE HCL 50 MG/ML IJ SOLN
INTRAMUSCULAR | Status: DC | PRN
  Administered 2020-10-10 (×2): 25 mg

## 2020-10-10 MED ORDER — MIDAZOLAM HCL 5 MG/5ML IJ SOLN
INTRAMUSCULAR | Status: AC
  Filled 2020-10-10: qty 10

## 2020-10-10 MED ORDER — MEPERIDINE HCL 50 MG/ML IJ SOLN
INTRAMUSCULAR | Status: AC
  Filled 2020-10-10: qty 1

## 2020-10-10 MED ORDER — LIDOCAINE VISCOUS HCL 2 % MT SOLN
OROMUCOSAL | Status: DC | PRN
  Administered 2020-10-10: 1 via OROMUCOSAL

## 2020-10-10 NOTE — H&P (Signed)
Carol Wood is an 75 y.o. female.   Chief Complaint: Patient is here for esophagogastroduodenoscopy and colonoscopy. HPI: Patient is 75 year old Caucasian female who has chronic GERD as well as history of colonic adenoma and family history of colon cancer as well as gastric cancer who is here for diagnostic EGD and surveillance colonoscopy.  PPI dose was reduced and she feels its not as effective but she is managing it.  She does experience nausea when she wakes up in the morning and using Rolaids.  She may have occasional dysphagia with meat she does not eat very often.  She has no difficulty with other foods or liquids.  Her bowels are regular.  She denies melena or rectal bleeding. Family history is significant for colon carcinoma in her brother who was diagnosed at age 79 and was already advanced age.  She also has 1 cousin with history of colon cancer.  Her maternal grandfather and maternal aunt both had gastric cancer. Last aspirin dose was 3 days ago.  Past Medical History:  Diagnosis Date  . Anemia   . Cancer (HCC)    Skin  . Depressive disorder, not elsewhere classified   . History of hiatal hernia   . Hypothyroidism   . Pneumonia   . Pure hypercholesterolemia   . Stroke [facility])    TIA  . TIA (transient ischemic attack)   . Type II or unspecified type diabetes mellitus without mention of complication, not stated as uncontrolled   . Unspecified essential hypertension     Past Surgical History:  Procedure Laterality Date  . BACK SURGERY    . BREAST LUMPECTOMY WITH RADIOACTIVE SEED LOCALIZATION Right 09/22/2018   Procedure: RIGHT BREAST LUMPECTOMY WITH RADIOACTIVE SEED LOCALIZATION;  Surgeon: Fanny Skates, MD;  Location: [facility];  Service: General;  Laterality: Right;  . CATARACT EXTRACTION, BILATERAL     Laser  . COLONOSCOPY    . COLONOSCOPY N/A 07/31/2015   Procedure: COLONOSCOPY;  Surgeon: Rogene Houston, MD;  [facility];  Service: Endoscopy;  Laterality: N/A;   1200 - moved to 2/1 @ 10:30  . ESOPHAGEAL DILATION N/A 09/06/2014   Procedure: ESOPHAGEAL DILATION;  Surgeon: Rogene Houston, MD;  [facility];  Service: Endoscopy;  Laterality: N/A;  . ESOPHAGOGASTRODUODENOSCOPY N/A 09/06/2014   Procedure: ESOPHAGOGASTRODUODENOSCOPY (EGD);  Surgeon: Rogene Houston, MD;  [facility];  Service: Endoscopy;  Laterality: N/A;  220  . EYE SURGERY    . TUBAL LIGATION      Family History  Problem Relation Age of Onset  . Cancer Mother 39       Lung  . Colon cancer Brother   . Heart disease Son 17       CABG  . Heart disease Son 10       CABG   Social History:  reports that she has never smoked. She has never used smokeless tobacco. She reports that she does not drink alcohol and does not use drugs.  Allergies:  Allergies  Allergen Reactions  . Metformin And Related Nausea And Vomiting  . Tamoxifen Other (See Comments)    Joint pain/fatigue  . Janumet [Sitagliptin-Metformin Hcl] Palpitations    Irregular heartbeat    Medications Prior to Admission  Medication Sig Dispense Refill  . aspirin 325 MG tablet Take 325 mg by mouth every evening.    Marland Kitchen b complex vitamins tablet Take 1 tablet by mouth every evening.    . carboxymethylcellul-glycerin (LUBRICANT DROPS/DUAL-ACTION) 0.5-0.9 %  ophthalmic solution Place 1 drop into both eyes in the morning and at bedtime.    . Cholecalciferol (VITAMIN D) 50 MCG (2000 UT) CAPS Take 4,000 Units by mouth every evening.    . Cyanocobalamin (VITAMIN B-12) 5000 MCG TBDP Take 5,000 mcg by mouth every evening.    . Flaxseed, Linseed, (FLAXSEED OIL) 1400 MG CAPS Take 1,400 mg by mouth in the morning and at bedtime.    Marland Kitchen glipiZIDE (GLUCOTROL XL) 10 MG 24 hr tablet Take 10 mg by mouth 2 (two) times daily.    . Glucosamine HCl 1500 MG TABS Take 1,500 mg by mouth in the morning and at bedtime.    Marland Kitchen ibuprofen (ADVIL) 200 MG tablet Take 800 mg by mouth daily as needed (pain).    . insulin glargine (LANTUS)  100 UNIT/ML injection Inject 25 Units into the skin in the morning.    Marland Kitchen levothyroxine (SYNTHROID, LEVOTHROID) 75 MCG tablet Take 75 mcg by mouth daily before breakfast.     . lisinopril (PRINIVIL,ZESTRIL) 20 MG tablet Take 20 mg by mouth 2 (two) times daily.     . Magnesium 250 MG TABS Take 250 mg by mouth every evening.    . metoprolol succinate (TOPROL-XL) 50 MG 24 hr tablet Take 50 mg by mouth in the morning.    . Multiple Vitamin (MULTIVITAMIN WITH MINERALS) TABS tablet Take 1 tablet by mouth every evening.    . Multiple Vitamins-Minerals (VITEYES AREDS FORMULA PO) Take 1 tablet by mouth 2 (two) times daily.    . pantoprazole (PROTONIX) 40 MG tablet Take 40 mg by mouth at bedtime.     Marland Kitchen ACCU-CHEK SOFTCLIX LANCETS lancets       Results for orders placed or performed during the hospital encounter of 10/10/20 (from the past 48 hour(s))  Glucose, capillary     Status: Abnormal   Collection Time: 10/10/20  9:55 AM  Result Value Ref Range   Glucose-Capillary 104 (H) 70 - 99 mg/dL    Comment: Glucose reference range applies only to samples taken after fasting for at least 8 hours.   No results found.  Review of Systems  Blood pressure 139/71, temperature 98 F (36.7 C), temperature source Oral, resp. rate 17, height 5\' 1"  (1.549 m), weight 77.1 kg, SpO2 96 %. Physical Exam HENT:     Mouth/Throat:     Mouth: Mucous membranes are moist.     Pharynx: Oropharynx is clear.  Cardiovascular:     Rate and Rhythm: Normal rate and regular rhythm.     Heart sounds: Normal heart sounds. No murmur heard.   Pulmonary:     Effort: Pulmonary effort is normal.     Breath sounds: Normal breath sounds.  Abdominal:     General: There is no distension.     Palpations: Abdomen is soft. There is no mass.     Tenderness: There is no abdominal tenderness.  Musculoskeletal:        General: No swelling.     Cervical back: Neck supple.  Lymphadenopathy:     Cervical: No cervical adenopathy.  Skin:     General: Skin is warm and dry.  Neurological:     Mental Status: She is alert.      Assessment/Plan  Chronic GERD. History of colonic adenoma. Family history of colon carcinoma and of first and second-degree relatives. Diagnostic EGD and surveillance colonoscopy.  Hildred Laser, MD 10/10/2020, 10:41 AM

## 2020-10-10 NOTE — Op Note (Signed)
[facility] Patient Name: Carol Wood Procedure Date: 10/10/2020 10:34 AM MRN: [id number] Date of Birth: 1945/08/22 Attending MD: Hildred Laser , MD CSN: [id number] Age: 75 Admit Type: Outpatient Procedure:                Upper GI endoscopy Indications:              Follow-up of gastro-esophageal reflux disease Providers:                Hildred Laser, MD, Janeece Riggers, RN, Raphael Gibney,                            Technician Referring MD:             Glenda Chroman, MD Medicines:                Lidocaine jelly, Meperidine 50 mg IV, Midazolam 6                            mg IV Complications:            No immediate complications. Estimated Blood Loss:     Estimated blood loss was minimal. Procedure:                Pre-Anesthesia Assessment:                           - Prior to the procedure, a History and Physical                            was performed, and patient medications and                            allergies were reviewed. The patient's tolerance of                            previous anesthesia was also reviewed. The risks                            and benefits of the procedure and the sedation                            options and risks were discussed with the patient.                            All questions were answered, and informed consent                            was obtained. Prior Anticoagulants: The patient has                            taken no previous anticoagulant or antiplatelet                            agents except for aspirin and has taken no previous  anticoagulant or antiplatelet agents except for                            NSAID medication. ASA Grade Assessment: II - A                            patient with mild systemic disease. After reviewing                            the risks and benefits, the patient was deemed in                            satisfactory condition to undergo the procedure.                            After obtaining informed consent, the endoscope was                            passed under direct vision. Throughout the                            procedure, the patient's blood pressure, pulse, and                            oxygen saturations were monitored continuously. The                            [id number]-[id number] ([id number]) scope was introduced through the                            mouth, and advanced to the second part of duodenum.                            The upper GI endoscopy was accomplished without                            difficulty. The patient tolerated the procedure                            well. Scope In: 10:54:21 AM Scope Out: 11:03:20 AM Total Procedure Duration: 0 hours 8 minutes 59 seconds  Findings:      The hypopharynx was normal.      The examined esophagus was normal.      The Z-line was irregular and was found 33 cm from the incisors. Biopsies       were taken with a cold forceps for histology. The pathology specimen was       placed into Bottle Number 1.      A 3 cm hiatal hernia was present.      The entire examined stomach was normal.      The duodenal bulb and second portion of the duodenum were normal. Impression:               - Normal hypopharynx.                           -  Normal esophagus.                           - Z-line irregular, 33 cm from the incisors.                            Biopsied.                           - 3 cm hiatal hernia.                           - Normal stomach.                           - Normal duodenal bulb and second portion of the                            duodenum. Moderate Sedation:      Moderate (conscious) sedation was administered by the endoscopy nurse       and supervised by the endoscopist. The following parameters were       monitored: oxygen saturation, heart rate, blood pressure, CO2       capnography and response to care. Total physician intraservice time was       14 minutes. Recommendation:            - Patient has a contact number available for                            emergencies. The signs and symptoms of potential                            delayed complications were discussed with the                            patient. Return to normal activities tomorrow.                            Written discharge instructions were provided to the                            patient.                           - Resume previous diet today.                           - Continue present medications.                           - No aspirin, ibuprofen, naproxen, or other                            non-steroidal anti-inflammatory drugs for 1 day.                           - Await pathology results. Procedure Code(s):        ---  Professional ---                           [phone], Esophagogastroduodenoscopy, flexible,                            transoral; with biopsy, single or multiple                           G0500, Moderate sedation services provided by the                            same physician or other qualified health care                            professional performing a gastrointestinal                            endoscopic service that sedation supports,                            requiring the presence of an independent trained                            observer to assist in the monitoring of the                            patient's level of consciousness and physiological                            status; initial 15 minutes of intra-service time;                            patient age 80 years or older (additional time may                            be reported with [phone], as appropriate) Diagnosis Code(s):        --- Professional ---                           K22.8, Other specified diseases of esophagus                           K44.9, Diaphragmatic hernia without obstruction or                            gangrene                           K21.9, Gastro-esophageal reflux disease  without                            esophagitis CPT copyright 2019 [employer]. All rights reserved. The codes documented in this report are preliminary and upon coder review may  be revised to meet current compliance requirements. Hildred Laser, MD Hildred Laser, MD 10/10/2020  11:47:40 AM This report has been signed electronically. Number of Addenda: 0

## 2020-10-10 NOTE — Op Note (Signed)
[facility] Patient Name: Carol Wood Procedure Date: 10/10/2020 11:05 AM MRN: [id number] Date of Birth: 01-19-1946 Attending MD: Hildred Laser , MD CSN: [id number] Age: 75 Admit Type: Outpatient Procedure:                Colonoscopy Indications:              High risk colon cancer surveillance: Personal                            history of colonic polyps Providers:                Hildred Laser, MD, Janeece Riggers, RN, Raphael Gibney,                            Technician Referring MD:             Glenda Chroman, MD Medicines:                None Complications:            No immediate complications. Estimated Blood Loss:     Estimated blood loss was minimal. Procedure:                Pre-Anesthesia Assessment:                           - Prior to the procedure, a History and Physical                            was performed, and patient medications and                            allergies were reviewed. The patient's tolerance of                            previous anesthesia was also reviewed. The risks                            and benefits of the procedure and the sedation                            options and risks were discussed with the patient.                            All questions were answered, and informed consent                            was obtained. Prior Anticoagulants: The patient has                            taken no previous anticoagulant or antiplatelet                            agents except for aspirin and has taken no previous  anticoagulant or antiplatelet agents except for                            NSAID medication. ASA Grade Assessment: II - A                            patient with mild systemic disease. After reviewing                            the risks and benefits, the patient was deemed in                            satisfactory condition to undergo the procedure.                           - Prior to the  procedure, a History and Physical                            was performed, and patient medications and                            allergies were reviewed. The patient's tolerance of                            previous anesthesia was also reviewed. The risks                            and benefits of the procedure and the sedation                            options and risks were discussed with the patient.                            All questions were answered, and informed consent                            was obtained. Prior Anticoagulants: The patient has                            taken no previous anticoagulant or antiplatelet                            agents except for aspirin and has taken no previous                            anticoagulant or antiplatelet agents except for                            NSAID medication. ASA Grade Assessment: II - A                            patient with mild systemic disease. After reviewing  the risks and benefits, the patient was deemed in                            satisfactory condition to undergo the procedure.                           After obtaining informed consent, the colonoscope                            was passed under direct vision. Throughout the                            procedure, the patient's blood pressure, pulse, and                            oxygen saturations were monitored continuously. The                            [id number]-[id number]) was introduced through the anus                            and advanced to the the cecum, identified by                            appendiceal orifice and ileocecal valve. The                            colonoscopy was performed without difficulty. The                            patient tolerated the procedure well. The quality                            of the bowel preparation was adequate. The                            ileocecal valve, appendiceal orifice,  and rectum                            were photographed. Scope In: 11:06:39 AM Scope Out: 11:31:03 AM Scope Withdrawal Time: 0 hours 12 minutes 30 seconds  Total Procedure Duration: 0 hours 24 minutes 24 seconds  Findings:      Skin tags were found on perianal exam.      Two polyps were found in the hepatic flexure. The polyps were small in       size. These polyps were removed with a cold snare. Resection was       complete, but the polyp tissue was only partially retrieved. The       pathology specimen was placed into Bottle Number 1.      The exam was otherwise normal throughout the examined colon.      The retroflexed view of the distal rectum and anal verge was normal and       showed no anal or rectal abnormalities. Impression:               -  Perianal skin tags found on perianal exam.                           - Two small polyps at the hepatic flexure, removed                            with a cold snare. Complete resection. Partial                            retrieval. Moderate Sedation:      Moderate (conscious) sedation was administered by the endoscopy nurse       and supervised by the endoscopist. The following parameters were       monitored: oxygen saturation, heart rate, blood pressure, CO2       capnography and response to care. Total physician intraservice time was       29 minutes. Recommendation:           - Patient has a contact number available for                            emergencies. The signs and symptoms of potential                            delayed complications were discussed with the                            patient. Return to normal activities tomorrow.                            Written discharge instructions were provided to the                            patient.                           - Resume previous diet today.                           - Continue present medications.                           - No aspirin, ibuprofen, naproxen, or other                             non-steroidal anti-inflammatory drugs for 1 day.                           - Await pathology results.                           - Repeat colonoscopy in 5 years for surveillance. Procedure Code(s):        --- Professional ---                           [phone], Colonoscopy, flexible; with removal of  tumor(s), polyp(s), or other lesion(s) by snare                            technique                           99153, Moderate sedation; each additional 15                            minutes intraservice time                           G0500, Moderate sedation services provided by the                            same physician or other qualified health care                            professional performing a gastrointestinal                            endoscopic service that sedation supports,                            requiring the presence of an independent trained                            observer to assist in the monitoring of the                            patient's level of consciousness and physiological                            status; initial 15 minutes of intra-service time;                            patient age 18 years or older (additional time may                            be reported with [phone], as appropriate) Diagnosis Code(s):        --- Professional ---                           Z86.010, Personal history of colonic polyps                           K63.5, Polyp of colon                           K64.4, Residual hemorrhoidal skin tags CPT copyright 2019 [employer]. All rights reserved. The codes documented in this report are preliminary and upon coder review may  be revised to meet current compliance requirements. Hildred Laser, MD Hildred Laser, MD 10/10/2020 11:52:40 AM This report has been signed electronically. Number of Addenda: 0

## 2020-10-10 NOTE — Discharge Instructions (Signed)
Resume aspirin on 10/11/2020 Keep ibuprofen use to minimum and take no more than 400 mg in a day.  Combination of full dose aspirin and ibuprofen increases risk of GI bleed and peptic ulcer disease. Resume other medications as before.  Remember to take pantoprazole 30 minutes before evening meal rather than at bedtime. No driving for 24 hours. Physician will call with biopsy results.   Colonoscopy, Adult, Care After This sheet gives you information about how to care for yourself after your procedure. Your doctor may also give you more specific instructions. If you have problems or questions, call your doctor. What can I expect after the procedure? After the procedure, it is common to have:  A small amount of blood in your poop (stool) for 24 hours.  Some gas.  Mild cramping or bloating in your belly (abdomen). Follow these instructions at home: Eating and drinking  Drink enough fluid to keep your pee (urine) pale yellow.  Follow instructions from your doctor about what you cannot eat or drink.  Return to your normal diet as told by your doctor. Avoid heavy or fried foods that are hard to digest.   Activity  Rest as told by your doctor.  Do not sit for a long time without moving. Get up to take short walks every 1-2 hours. This is important. Ask for help if you feel weak or unsteady.  Return to your normal activities as told by your doctor. Ask your doctor what activities are safe for you. To help cramping and bloating:  Try walking around.  Put heat on your belly as told by your doctor. Use the heat source that your doctor recommends, such as a moist heat pack or a heating pad. ? Put a towel between your skin and the heat source. ? Leave the heat on for 20-30 minutes. ? Remove the heat if your skin turns bright red. This is very important if you are unable to feel pain, heat, or cold. You may have a greater risk of getting burned.   General instructions  If you were given a  medicine to help you relax (sedative) during your procedure, it can affect you for many hours. Do not drive or use machinery until your doctor says that it is safe.  For the first 24 hours after the procedure: ? Do not sign important documents. ? Do not drink alcohol. ? Do your daily activities more slowly than normal. ? Eat foods that are soft and easy to digest.  Take over-the-counter or prescription medicines only as told by your doctor.  Keep all follow-up visits as told by your doctor. This is important. Contact a doctor if:  You have blood in your poop 2-3 days after the procedure. Get help right away if:  You have more than a small amount of blood in your poop.  You see large clumps of tissue (blood clots) in your poop.  Your belly is swollen.  You feel like you may vomit (nauseous).  You vomit.  You have a fever.  You have belly pain that gets worse, and medicine does not help your pain. Summary  After the procedure, it is common to have a small amount of blood in your poop. You may also have mild cramping and bloating in your belly.  If you were given a medicine to help you relax (sedative) during your procedure, it can affect you for many hours. Do not drive or use machinery until your doctor says that it is safe.  Get help right away if you have a lot of blood in your poop, feel like you may vomit, have a fever, or have more belly pain. This information is not intended to replace advice given to you by your health care provider. Make sure you discuss any questions you have with your health care provider. Document Revised: 04/21/2019 Document Reviewed: 01/09/2019 Elsevier Patient Education  [place].  Colon Polyps  Colon polyps are tissue growths inside the colon, which is part of the large intestine. They are one of the types of polyps that can grow in the body. A polyp may be a round bump or a mushroom-shaped growth. You could have one polyp or more  than one. Most colon polyps are noncancerous (benign). However, some colon polyps can become cancerous over time. Finding and removing the polyps early can help prevent this. What are the causes? The exact cause of colon polyps is not known. What increases the risk? The following factors may make you more likely to develop this condition:  Having a family history of colorectal cancer or colon polyps.  Being older than 77 years of age.  Being younger than 77 years of age and having a significant family history of colorectal cancer or colon polyps or a genetic condition that puts you at higher risk of getting colon polyps.  Having inflammatory bowel disease, such as ulcerative colitis or Crohn's disease.  Having certain conditions passed from parent to child (hereditary conditions), such as: ? Familial adenomatous polyposis (FAP). ? Lynch syndrome. ? Turcot syndrome. ? Peutz-Jeghers syndrome. ? MUTYH-associated polyposis (MAP).  Being overweight.  Certain lifestyle factors. These include smoking cigarettes, drinking too much alcohol, not getting enough exercise, and eating a diet that is high in fat and red meat and low in fiber.  Having had childhood cancer that was treated with radiation of the abdomen. What are the signs or symptoms? Many times, there are no symptoms. If you have symptoms, they may include:  Blood coming from the rectum during a bowel movement.  Blood in the stool (feces). The blood may be bright red or very dark in color.  Pain in the abdomen.  A change in bowel habits, such as constipation or diarrhea. How is this diagnosed? This condition is diagnosed with a colonoscopy. This is a procedure in which a lighted, flexible scope is inserted into the opening between the buttocks (anus) and then passed into the colon to examine the area. Polyps are sometimes found when a colonoscopy is done as part of routine cancer screening tests. How is this treated? This  condition is treated by removing any polyps that are found. Most polyps can be removed during a colonoscopy. Those polyps will then be tested for cancer. Additional treatment may be needed depending on the results of testing. Follow these instructions at home: Eating and drinking  Eat foods that are high in fiber, such as fruits, vegetables, and whole grains.  Eat foods that are high in calcium and vitamin D, such as milk, cheese, yogurt, eggs, liver, fish, and broccoli.  Limit foods that are high in fat, such as fried foods and desserts.  Limit the amount of red meat, precooked or cured meat, or other processed meat that you eat, such as hot dogs, sausages, bacon, or meat loaves.  Limit sugary drinks.   Lifestyle  Maintain a healthy weight, or lose weight if recommended by your health care provider.  Exercise every day or as told by your health care  provider.  Do not use any products that contain nicotine or tobacco, such as cigarettes, e-cigarettes, and chewing tobacco. If you need help quitting, ask your health care provider.  Do not drink alcohol if: ? Your health care provider tells you not to drink. ? You are pregnant, may be pregnant, or are planning to become pregnant.  If you drink alcohol: ? Limit how much you use to:  0-1 drink a day for women.  0-2 drinks a day for men. ? Know how much alcohol is in your drink. In the [place] one drink equals one 12 oz bottle of beer (355 mL), one 5 oz glass of wine (148 mL), or one 1 oz glass of hard liquor (44 mL). General instructions  Take over-the-counter and prescription medicines only as told by your health care provider.  Keep all follow-up visits. This is important. This includes having regularly scheduled colonoscopies. Talk to your health care provider about when you need a colonoscopy. Contact a health care provider if:  You have new or worsening bleeding during a bowel movement.  You have new or increased blood in your  stool.  You have a change in bowel habits.  You lose weight for no known reason. Summary  Colon polyps are tissue growths inside the colon, which is part of the large intestine. They are one type of polyp that can grow in the body.  Most colon polyps are noncancerous (benign), but some can become cancerous over time.  This condition is diagnosed with a colonoscopy.  This condition is treated by removing any polyps that are found. Most polyps can be removed during a colonoscopy. This information is not intended to replace advice given to you by your health care provider. Make sure you discuss any questions you have with your health care provider. Document Revised: 10/04/2019 Document Reviewed: 10/04/2019 Elsevier Patient Education  2021 [place].  Upper Endoscopy, Adult, Care After This sheet gives you information about how to care for yourself after your procedure. Your health care provider may also give you more specific instructions. If you have problems or questions, contact your health care provider. What can I expect after the procedure? After the procedure, it is common to have:  A sore throat.  Mild stomach pain or discomfort.  Bloating.  Nausea. Follow these instructions at home:  Follow instructions from your health care provider about what to eat or drink after your procedure.  Return to your normal activities as told by your health care provider. Ask your health care provider what activities are safe for you.  Take over-the-counter and prescription medicines only as told by your health care provider.  If you were given a sedative during the procedure, it can affect you for several hours. Do not drive or operate machinery until your health care provider says that it is safe.  Keep all follow-up visits as told by your health care provider. This is important.   Contact a health care provider if you have:  A sore throat that lasts longer than one day.  Trouble  swallowing. Get help right away if:  You vomit blood or your vomit looks like coffee grounds.  You have: ? A fever. ? Bloody, black, or tarry stools. ? A severe sore throat or you cannot swallow. ? Difficulty breathing. ? Severe pain in your chest or abdomen. Summary  After the procedure, it is common to have a sore throat, mild stomach discomfort, bloating, and nausea.  If you were given a  sedative during the procedure, it can affect you for several hours. Do not drive or operate machinery until your health care provider says that it is safe.  Follow instructions from your health care provider about what to eat or drink after your procedure.  Return to your normal activities as told by your health care provider. This information is not intended to replace advice given to you by your health care provider. Make sure you discuss any questions you have with your health care provider. Document Revised: 06/13/2019 Document Reviewed: 11/15/2017 Elsevier Patient Education  [place].  Hiatal Hernia  A hiatal hernia occurs when part of the stomach slides above the muscle that separates the abdomen from the chest (diaphragm). A person can be born with a hiatal hernia (congenital), or it may develop over time. In almost all cases of hiatal hernia, only the top part of the stomach pushes through the diaphragm. Many people have a hiatal hernia with no symptoms. The larger the hernia, the more likely it is that you will have symptoms. In some cases, a hiatal hernia allows stomach acid to flow back into the tube that carries food from your mouth to your stomach (esophagus). This may cause heartburn symptoms. Severe heartburn symptoms may mean that you have developed a condition called gastroesophageal reflux disease (GERD). What are the causes? This condition is caused by a weakness in the opening (hiatus) where the esophagus passes through the diaphragm to attach to the upper part of the  stomach. A person may be born with a weakness in the hiatus, or a weakness can develop over time. What increases the risk? This condition is more likely to develop in:  Older people. Age is a major risk factor for a hiatal hernia, especially if you are over the age of 65.  Pregnant women.  People who are overweight.  People who have frequent constipation. What are the signs or symptoms? Symptoms of this condition usually develop in the form of GERD symptoms. Symptoms include:  Heartburn.  Belching.  Indigestion.  Trouble swallowing.  Coughing or wheezing.  Sore throat.  Hoarseness.  Chest pain.  Nausea and vomiting. How is this diagnosed? This condition may be diagnosed during testing for GERD. Tests that may be done include:  X-rays of your stomach or chest.  An upper gastrointestinal (GI) series. This is an X-ray exam of your GI tract that is taken after you swallow a chalky liquid that shows up clearly on the X-ray.  Endoscopy. This is a procedure to look into your stomach using a thin, flexible tube that has a tiny camera and light on the end of it. How is this treated? This condition may be treated by:  Dietary and lifestyle changes to help reduce GERD symptoms.  Medicines. These may include: ? Over-the-counter antacids. ? Medicines that make your stomach empty more quickly. ? Medicines that block the production of stomach acid (H2 blockers). ? Stronger medicines to reduce stomach acid (proton pump inhibitors).  Surgery to repair the hernia, if other treatments are not helping. If you have no symptoms, you may not need treatment. Follow these instructions at home: Lifestyle and activity  Do not use any products that contain nicotine or tobacco, such as cigarettes and e-cigarettes. If you need help quitting, ask your health care provider.  Try to achieve and maintain a healthy body weight.  Avoid putting pressure on your abdomen. Anything that puts  pressure on your abdomen increases the amount of acid that  may be pushed up into your esophagus. ? Avoid bending over, especially after eating. ? Raise the head of your bed by putting blocks under the legs. This keeps your head and esophagus higher than your stomach. ? Do not wear tight clothing around your chest or stomach. ? Try not to strain when having a bowel movement, when urinating, or when lifting heavy objects. Eating and drinking  Avoid foods that can worsen GERD symptoms. These may include: ? Fatty foods, like fried foods. ? Citrus fruits, like oranges or lemon. ? Other foods and drinks that contain acid, like orange juice or tomatoes. ? Spicy food. ? Chocolate.  Eat frequent small meals instead of three large meals a day. This helps prevent your stomach from getting too full. ? Eat slowly. ? Do not lie down right after eating. ? Do not eat 1-2 hours before bed.  Do not drink beverages with caffeine. These include cola, coffee, cocoa, and tea.  Do not drink alcohol. General instructions  Take over-the-counter and prescription medicines only as told by your health care provider.  Keep all follow-up visits as told by your health care provider. This is important. Contact a health care provider if:  Your symptoms are not controlled with medicines or lifestyle changes.  You are having trouble swallowing.  You have coughing or wheezing that will not go away. Get help right away if:  Your pain is getting worse.  Your pain spreads to your arms, neck, jaw, teeth, or back.  You have shortness of breath.  You sweat for no reason.  You feel sick to your stomach (nauseous) or you vomit.  You vomit blood.  You have bright red blood in your stools.  You have black, tarry stools. This information is not intended to replace advice given to you by your health care provider. Make sure you discuss any questions you have with your health care provider. Document Revised:  05/28/2017 Document Reviewed: 01/18/2017 Elsevier Patient Education  [place].

## 2020-10-11 LAB — SURGICAL PATHOLOGY

## 2020-10-15 ENCOUNTER — Encounter (INDEPENDENT_AMBULATORY_CARE_PROVIDER_SITE_OTHER): Payer: Self-pay | Admitting: *Deleted

## 2020-10-18 ENCOUNTER — Encounter (HOSPITAL_COMMUNITY): Payer: Self-pay | Admitting: Internal Medicine

## 2020-10-24 DIAGNOSIS — H40013 Open angle with borderline findings, low risk, bilateral: Secondary | ICD-10-CM | POA: Diagnosis not present

## 2020-10-24 DIAGNOSIS — H353131 Nonexudative age-related macular degeneration, bilateral, early dry stage: Secondary | ICD-10-CM | POA: Diagnosis not present

## 2020-10-24 DIAGNOSIS — H04123 Dry eye syndrome of bilateral lacrimal glands: Secondary | ICD-10-CM | POA: Diagnosis not present

## 2020-10-24 DIAGNOSIS — E113292 Type 2 diabetes mellitus with mild nonproliferative diabetic retinopathy without macular edema, left eye: Secondary | ICD-10-CM | POA: Diagnosis not present

## 2020-10-25 DIAGNOSIS — E1169 Type 2 diabetes mellitus with other specified complication: Secondary | ICD-10-CM | POA: Diagnosis not present

## 2020-11-06 NOTE — Progress Notes (Signed)
Patient Care Team: Glenda Chroman, MD as PCP - General (Internal Medicine) Jacelyn Pi, MD as Attending Physician (Endocrinology) Meda Klinefelter, MD as Referring Physician (Radiation Oncology) Nicholas Lose, MD as Consulting Physician (Hematology and Oncology) Fanny Skates, MD as Consulting Physician (General Surgery)  DIAGNOSIS:    ICD-10-CM   1. Ductal carcinoma in situ (DCIS) of right breast  D05.11     SUMMARY OF ONCOLOGIC HISTORY: Oncology History  Ductal carcinoma in situ (DCIS) of right breast  08/17/2018 Initial Diagnosis   Screening mammogram [facility] detected distortion in the right breast 12 o'clock position.  Biopsy revealed intermediate grade DCIS with calcifications, ER 100%, PR 100%, Tis NX stage 0   09/22/2018 Surgery   Left lumpectomy (Dr. Dalbert Batman): DCIS, 1.5cm, intermediate grade, margins negative, ER 100%, PR 100%.    09/22/2018 Cancer Staging   Staging form: Breast, AJCC 8th Edition - Pathologic stage from 09/22/2018: Stage 0 (pTis (DCIS), pN0, cM0, ER+, PR+) - Signed by Gardenia Phlegm, NP on 03/22/2019   09/29/2018 -  Anti-estrogen oral therapy   Tamoxifen, plan or 5 years.   12/05/2018 - 01/04/2019 Radiation Therapy   Ad[facility] with Dr. Francesca Jewett: Right whole breast 3D CRT 6 and 18 MV 266 cGy 16 4256 cGy 12/05/2018   12/27/2018  Lumpectomy boost 3D CRT 6 MV 200 cGy 5 1000 cGy 12/28/2018 01/04/2019      CHIEF COMPLIANT: Follow-up of right breast cancer   INTERVAL HISTORY: Carol Wood is a 75 y.o. with above-mentioned history of right breast cancer treated with lumpectomy, radiation, and is currently on tamoxifen.She presentsto the clinictodayfor follow-up.  Since she stopped tamoxifen she is doing much better.  Her mood swings and emotional difficulties have resolved.  ALLERGIES:  is allergic to metformin and related, tamoxifen, and janumet [sitagliptin-metformin hcl].  MEDICATIONS:  Current Outpatient Medications   Medication Sig Dispense Refill  . ACCU-CHEK SOFTCLIX LANCETS lancets     . aspirin 325 MG tablet Take 1 tablet (325 mg total) by mouth every evening.    Marland Kitchen b complex vitamins tablet Take 1 tablet by mouth every evening.    . carboxymethylcellul-glycerin (LUBRICANT DROPS/DUAL-ACTION) 0.5-0.9 % ophthalmic solution Place 1 drop into both eyes in the morning and at bedtime.    . Cholecalciferol (VITAMIN D) 50 MCG (2000 UT) CAPS Take 4,000 Units by mouth every evening.    . Cyanocobalamin (VITAMIN B-12) 5000 MCG TBDP Take 5,000 mcg by mouth every evening.    . Flaxseed, Linseed, (FLAXSEED OIL) 1400 MG CAPS Take 1,400 mg by mouth in the morning and at bedtime.    Marland Kitchen glipiZIDE (GLUCOTROL XL) 10 MG 24 hr tablet Take 10 mg by mouth 2 (two) times daily.    . Glucosamine HCl 1500 MG TABS Take 1,500 mg by mouth in the morning and at bedtime.    Marland Kitchen ibuprofen (ADVIL) 200 MG tablet Take 800 mg by mouth daily as needed (pain).    . insulin glargine (LANTUS) 100 UNIT/ML injection Inject 25 Units into the skin in the morning.    Marland Kitchen levothyroxine (SYNTHROID, LEVOTHROID) 75 MCG tablet Take 75 mcg by mouth daily before breakfast.     . lisinopril (PRINIVIL,ZESTRIL) 20 MG tablet Take 20 mg by mouth 2 (two) times daily.     . Magnesium 250 MG TABS Take 250 mg by mouth every evening.    . metoprolol succinate (TOPROL-XL) 50 MG 24 hr tablet Take 50 mg by mouth in the morning.    Marland Kitchen  Multiple Vitamin (MULTIVITAMIN WITH MINERALS) TABS tablet Take 1 tablet by mouth every evening.    . Multiple Vitamins-Minerals (VITEYES AREDS FORMULA PO) Take 1 tablet by mouth 2 (two) times daily.    . pantoprazole (PROTONIX) 40 MG tablet Take 40 mg by mouth at bedtime.      No current facility-administered medications for this visit.    PHYSICAL EXAMINATION: ECOG PERFORMANCE STATUS: 1 - Symptomatic but completely ambulatory  Vitals:   11/07/20 1128  BP: (!) 158/70  Pulse: 70  Resp: 16  Temp: (!) 97.5 F (36.4 C)  SpO2: 100%    Filed Weights   11/07/20 1128  Weight: 176 lb 6.4 oz (80 kg)    BREAST: No palpable masses or nodules in either right or left breasts. No palpable axillary supraclavicular or infraclavicular adenopathy no breast tenderness or nipple discharge. (exam performed in the presence of a chaperone)  LABORATORY DATA:  I have reviewed the data as listed CMP Latest Ref Rng & Units 09/14/2018  Glucose 70 - 99 mg/dL 179(H)  BUN 8 - 23 mg/dL 14  Creatinine 0.44 - 1.00 mg/dL 0.86  Sodium 135 - 145 mmol/L 139  Potassium 3.5 - 5.1 mmol/L 4.6  Chloride 98 - 111 mmol/L 106  CO2 22 - 32 mmol/L 23  Calcium 8.9 - 10.3 mg/dL 9.8  Total Protein 6.5 - 8.1 g/dL 7.5  Total Bilirubin 0.3 - 1.2 mg/dL 0.4  Alkaline Phos 38 - 126 U/L 79  AST 15 - 41 U/L 26  ALT 0 - 44 U/L 28    Lab Results  Component Value Date   WBC 7.9 09/14/2018   HGB 14.4 09/14/2018   HCT 47.1 (H) 09/14/2018   MCV 94.6 09/14/2018   PLT 364 09/14/2018   NEUTROABS 4.2 09/14/2018    ASSESSMENT & PLAN:  Ductal carcinoma in situ (DCIS) of right breast 08/17/2018:Screening mammogram [facility] detected distortion in the right breast 12 o'clock position. Biopsy revealed intermediate grade DCIS with calcifications, ER 100%, PR 100%, Tis NX stage 0  09/22/2018:Left lumpectomy (Dr. Dalbert Batman): DCIS, 1.5cm, intermediate grade, margins negative, ER 100%, PR 100%Tis NX stage 0  Recommendation: 1.Adjuvant radiation therapy(delayed due to COVID-19) 2.Other adjuvant antiestrogen therapy with tamoxifen x5 years.Started 09/29/2018 discontinued December 2021 because of profound mood swings and hot flashes and depression All of the symptoms have improved.  Her son passed away in 06-Oct-2018 and her sister passed away in 10-05-2020   Breast cancer surveillance: 1.  Breast exam 11/07/2020: Benign 2.  Mammograms 08/28/2020: Benign stable postlumpectomy changes.  Breast density category B  Return to clinic in 1 year for follow up    No  orders of the defined types were placed in this encounter.  The patient has a good understanding of the overall plan. she agrees with it. she will call with any problems that may develop before the next visit here.  Total time spent: 20 mins including face to face time and time spent for planning, charting and coordination of care  Rulon Eisenmenger, MD, MPH 11/07/2020  I, Molly Dorshimer, am acting as scribe for Dr. Nicholas Lose.  I have reviewed the above documentation for accuracy and completeness, and I agree with the above.

## 2020-11-07 ENCOUNTER — Inpatient Hospital Stay: Payer: Medicare Other | Attending: Hematology and Oncology | Admitting: Hematology and Oncology

## 2020-11-07 ENCOUNTER — Other Ambulatory Visit: Payer: Self-pay

## 2020-11-07 DIAGNOSIS — Z170 Estrogen receptor positive status [ER+]: Secondary | ICD-10-CM | POA: Insufficient documentation

## 2020-11-07 DIAGNOSIS — Z79810 Long term (current) use of selective estrogen receptor modulators (SERMs): Secondary | ICD-10-CM | POA: Diagnosis not present

## 2020-11-07 DIAGNOSIS — D0511 Intraductal carcinoma in situ of right breast: Secondary | ICD-10-CM | POA: Diagnosis not present

## 2020-11-07 DIAGNOSIS — Z923 Personal history of irradiation: Secondary | ICD-10-CM | POA: Diagnosis not present

## 2020-11-07 NOTE — Assessment & Plan Note (Signed)
08/17/2018:Screening mammogram [facility] detected distortion in the right breast 12 o'clock position. Biopsy revealed intermediate grade DCIS with calcifications, ER 100%, PR 100%, Tis NX stage 0  09/22/2018:Left lumpectomy (Dr. Dalbert Batman): DCIS, 1.5cm, intermediate grade, margins negative, ER 100%, PR 100%Tis NX stage 0  Recommendation: 1.Adjuvant radiation therapy(delayed due to COVID-19) 2.Other adjuvant antiestrogen therapy with tamoxifen x5 years.Started 09/29/2018  Tamoxifen toxicities: Occ hot flashes. Mood swings and: Her son had passed away from an acute MI and she is still grieving from his loss. She does not want any medications to help go through this.  Breast cancer surveillance: 1.  Breast exam 11/07/2020: Benign 2.  Mammograms 08/28/2020: Benign stable postlumpectomy changes.  Breast density category B  Return to clinic in 1 year for follow up

## 2020-11-26 DIAGNOSIS — E1169 Type 2 diabetes mellitus with other specified complication: Secondary | ICD-10-CM | POA: Diagnosis not present

## 2020-12-17 DIAGNOSIS — Z299 Encounter for prophylactic measures, unspecified: Secondary | ICD-10-CM | POA: Diagnosis not present

## 2020-12-17 DIAGNOSIS — E1165 Type 2 diabetes mellitus with hyperglycemia: Secondary | ICD-10-CM | POA: Diagnosis not present

## 2020-12-17 DIAGNOSIS — I10 Essential (primary) hypertension: Secondary | ICD-10-CM | POA: Diagnosis not present

## 2020-12-17 DIAGNOSIS — E039 Hypothyroidism, unspecified: Secondary | ICD-10-CM | POA: Diagnosis not present

## 2020-12-26 DIAGNOSIS — E1169 Type 2 diabetes mellitus with other specified complication: Secondary | ICD-10-CM | POA: Diagnosis not present

## 2021-01-24 DIAGNOSIS — E1169 Type 2 diabetes mellitus with other specified complication: Secondary | ICD-10-CM | POA: Diagnosis not present

## 2021-01-26 DIAGNOSIS — E1169 Type 2 diabetes mellitus with other specified complication: Secondary | ICD-10-CM | POA: Diagnosis not present

## 2021-02-26 DIAGNOSIS — E1169 Type 2 diabetes mellitus with other specified complication: Secondary | ICD-10-CM | POA: Diagnosis not present

## 2021-03-26 DIAGNOSIS — E1165 Type 2 diabetes mellitus with hyperglycemia: Secondary | ICD-10-CM | POA: Diagnosis not present

## 2021-03-26 DIAGNOSIS — Z299 Encounter for prophylactic measures, unspecified: Secondary | ICD-10-CM | POA: Diagnosis not present

## 2021-03-26 DIAGNOSIS — K219 Gastro-esophageal reflux disease without esophagitis: Secondary | ICD-10-CM | POA: Diagnosis not present

## 2021-03-26 DIAGNOSIS — I10 Essential (primary) hypertension: Secondary | ICD-10-CM | POA: Diagnosis not present

## 2021-03-28 DIAGNOSIS — E1169 Type 2 diabetes mellitus with other specified complication: Secondary | ICD-10-CM | POA: Diagnosis not present

## 2021-04-28 DIAGNOSIS — R5383 Other fatigue: Secondary | ICD-10-CM | POA: Diagnosis not present

## 2021-04-28 DIAGNOSIS — Z7189 Other specified counseling: Secondary | ICD-10-CM | POA: Diagnosis not present

## 2021-04-28 DIAGNOSIS — E039 Hypothyroidism, unspecified: Secondary | ICD-10-CM | POA: Diagnosis not present

## 2021-04-28 DIAGNOSIS — Z79899 Other long term (current) drug therapy: Secondary | ICD-10-CM | POA: Diagnosis not present

## 2021-04-28 DIAGNOSIS — Z789 Other specified health status: Secondary | ICD-10-CM | POA: Diagnosis not present

## 2021-04-28 DIAGNOSIS — Z299 Encounter for prophylactic measures, unspecified: Secondary | ICD-10-CM | POA: Diagnosis not present

## 2021-04-28 DIAGNOSIS — E1169 Type 2 diabetes mellitus with other specified complication: Secondary | ICD-10-CM | POA: Diagnosis not present

## 2021-04-28 DIAGNOSIS — E7800 Pure hypercholesterolemia, unspecified: Secondary | ICD-10-CM | POA: Diagnosis not present

## 2021-04-28 DIAGNOSIS — Z0000 Encounter for general adult medical examination without abnormal findings: Secondary | ICD-10-CM | POA: Diagnosis not present

## 2021-04-28 DIAGNOSIS — I10 Essential (primary) hypertension: Secondary | ICD-10-CM | POA: Diagnosis not present

## 2021-05-28 DIAGNOSIS — E1169 Type 2 diabetes mellitus with other specified complication: Secondary | ICD-10-CM | POA: Diagnosis not present

## 2021-06-27 DIAGNOSIS — E1169 Type 2 diabetes mellitus with other specified complication: Secondary | ICD-10-CM | POA: Diagnosis not present

## 2021-07-11 DIAGNOSIS — I10 Essential (primary) hypertension: Secondary | ICD-10-CM | POA: Diagnosis not present

## 2021-07-11 DIAGNOSIS — T466X5A Adverse effect of antihyperlipidemic and antiarteriosclerotic drugs, initial encounter: Secondary | ICD-10-CM | POA: Diagnosis not present

## 2021-07-11 DIAGNOSIS — E1165 Type 2 diabetes mellitus with hyperglycemia: Secondary | ICD-10-CM | POA: Diagnosis not present

## 2021-07-11 DIAGNOSIS — Z299 Encounter for prophylactic measures, unspecified: Secondary | ICD-10-CM | POA: Diagnosis not present

## 2021-07-11 DIAGNOSIS — G720 Drug-induced myopathy: Secondary | ICD-10-CM | POA: Diagnosis not present

## 2021-07-11 DIAGNOSIS — I779 Disorder of arteries and arterioles, unspecified: Secondary | ICD-10-CM | POA: Diagnosis not present

## 2021-07-27 DIAGNOSIS — E1169 Type 2 diabetes mellitus with other specified complication: Secondary | ICD-10-CM | POA: Diagnosis not present

## 2021-08-01 DIAGNOSIS — H9042 Sensorineural hearing loss, unilateral, left ear, with unrestricted hearing on the contralateral side: Secondary | ICD-10-CM | POA: Diagnosis not present

## 2021-08-01 DIAGNOSIS — H6123 Impacted cerumen, bilateral: Secondary | ICD-10-CM | POA: Diagnosis not present

## 2021-08-26 DIAGNOSIS — E1169 Type 2 diabetes mellitus with other specified complication: Secondary | ICD-10-CM | POA: Diagnosis not present

## 2021-09-03 DIAGNOSIS — R922 Inconclusive mammogram: Secondary | ICD-10-CM | POA: Diagnosis not present

## 2021-09-03 DIAGNOSIS — Z853 Personal history of malignant neoplasm of breast: Secondary | ICD-10-CM | POA: Diagnosis not present

## 2021-09-25 DIAGNOSIS — E1169 Type 2 diabetes mellitus with other specified complication: Secondary | ICD-10-CM | POA: Diagnosis not present

## 2021-10-14 DIAGNOSIS — T466X5A Adverse effect of antihyperlipidemic and antiarteriosclerotic drugs, initial encounter: Secondary | ICD-10-CM | POA: Diagnosis not present

## 2021-10-14 DIAGNOSIS — Z299 Encounter for prophylactic measures, unspecified: Secondary | ICD-10-CM | POA: Diagnosis not present

## 2021-10-14 DIAGNOSIS — E1165 Type 2 diabetes mellitus with hyperglycemia: Secondary | ICD-10-CM | POA: Diagnosis not present

## 2021-10-14 DIAGNOSIS — G720 Drug-induced myopathy: Secondary | ICD-10-CM | POA: Diagnosis not present

## 2021-10-14 DIAGNOSIS — I10 Essential (primary) hypertension: Secondary | ICD-10-CM | POA: Diagnosis not present

## 2021-10-24 NOTE — Progress Notes (Signed)
? ?Patient Care Team: ?Glenda Chroman, MD as PCP - General (Internal Medicine) ?Jacelyn Pi, MD as Attending Physician (Endocrinology) ?Meda Klinefelter, MD as Referring Physician (Radiation Oncology) ?Nicholas Lose, MD as Consulting Physician (Hematology and Oncology) ?Fanny Skates, MD as Consulting Physician (General Surgery) ? ?DIAGNOSIS:  ?Encounter Diagnosis  ?Name Primary?  ? Ductal carcinoma in situ (DCIS) of right breast   ? ? ?SUMMARY OF ONCOLOGIC HISTORY: ?Oncology History  ?Ductal carcinoma in situ (DCIS) of right breast  ?08/17/2018 Initial Diagnosis  ? Screening mammogram [facility] detected distortion in the right breast 12 o'clock position.  Biopsy revealed intermediate grade DCIS with calcifications, ER 100%, PR 100%, Tis NX stage 0 ? ?  ?09/22/2018 Surgery  ? Left lumpectomy (Dr. Dalbert Batman): DCIS, 1.5cm, intermediate grade, margins negative, ER 100%, PR 100%.  ? ?  ?09/22/2018 Cancer Staging  ? Staging form: Breast, AJCC 8th Edition ?- Pathologic stage from 09/22/2018: Stage 0 (pTis (DCIS), pN0, cM0, ER+, PR+) - Signed by Gardenia Phlegm, NP on 03/22/2019 ? ?  ?09/29/2018 -  Anti-estrogen oral therapy  ? Tamoxifen, plan or 5 years. ?  ?12/05/2018 - 01/04/2019 Radiation Therapy  ? Adj RT at [facility] with Dr. Francesca Jewett: Right whole breast 3D CRT 6 and 18 MV 266 cGy 16 4256 cGy 12/05/2018 ?  12/27/2018  ?Lumpectomy boost 3D CRT 6 MV 200 cGy 5 1000 cGy 12/28/2018 01/04/2019  ?  ? ? ?CHIEF COMPLIANT:  Follow-up of right breast cancer  ? ?INTERVAL HISTORY: Carol Wood is a  76 y.o. with above-mentioned history of right breast cancer treated with lumpectomy, radiation, and is currently on tamoxifen. She presents to the clinic today for annual follow-up. She state she still have some arthritis. She state she still have some emotional state. Denies pain and discomfort. State that she had some chest tightness. State that the redness on her chest has lighten up. ? ? ?ALLERGIES:  is allergic to  metformin and related, tamoxifen, and janumet [sitagliptin-metformin hcl]. ? ?MEDICATIONS:  ?Current Outpatient Medications  ?Medication Sig Dispense Refill  ? ACCU-CHEK SOFTCLIX LANCETS lancets     ? aspirin 325 MG tablet Take 1 tablet (325 mg total) by mouth every evening.    ? b complex vitamins tablet Take 1 tablet by mouth every evening.    ? carboxymethylcellul-glycerin (LUBRICANT DROPS/DUAL-ACTION) 0.5-0.9 % ophthalmic solution Place 1 drop into both eyes in the morning and at bedtime.    ? Cholecalciferol (VITAMIN D) 50 MCG (2000 UT) CAPS Take 4,000 Units by mouth every evening.    ? Cyanocobalamin (VITAMIN B-12) 5000 MCG TBDP Take 5,000 mcg by mouth every evening.    ? Flaxseed, Linseed, (FLAXSEED OIL) 1400 MG CAPS Take 1,400 mg by mouth in the morning and at bedtime.    ? glipiZIDE (GLUCOTROL XL) 10 MG 24 hr tablet Take 10 mg by mouth 2 (two) times daily.    ? Glucosamine HCl 1500 MG TABS Take 1,500 mg by mouth in the morning and at bedtime.    ? ibuprofen (ADVIL) 200 MG tablet Take 800 mg by mouth daily as needed (pain).    ? insulin glargine (LANTUS) 100 UNIT/ML injection Inject 25 Units into the skin in the morning.    ? levothyroxine (SYNTHROID, LEVOTHROID) 75 MCG tablet Take 75 mcg by mouth daily before breakfast.     ? lisinopril (PRINIVIL,ZESTRIL) 20 MG tablet Take 20 mg by mouth 2 (two) times daily.     ? Magnesium 250 MG TABS Take 250 mg  by mouth every evening.    ? metoprolol succinate (TOPROL-XL) 50 MG 24 hr tablet Take 50 mg by mouth in the morning.    ? Multiple Vitamin (MULTIVITAMIN WITH MINERALS) TABS tablet Take 1 tablet by mouth every evening.    ? Multiple Vitamins-Minerals (VITEYES AREDS FORMULA PO) Take 1 tablet by mouth 2 (two) times daily.    ? pantoprazole (PROTONIX) 40 MG tablet Take 40 mg by mouth at bedtime.     ? ?No current facility-administered medications for this visit.  ? ? ?PHYSICAL EXAMINATION: ?ECOG PERFORMANCE STATUS: 1 - Symptomatic but completely ambulatory ? ?Vitals:   ? 11/07/21 1005  ?BP: (!) 168/69  ?Pulse: 67  ?Resp: 16  ?Temp: (!) 97 ?F (36.1 ?C)  ?SpO2: 99%  ? ?Filed Weights  ? 11/07/21 1005  ?Weight: 175 lb 14.4 oz (79.8 kg)  ?  ? ?LABORATORY DATA:  ?I have reviewed the data as listed ? ?  Latest Ref Rng & Units 09/14/2018  ?  9:52 AM  ?CMP  ?Glucose 70 - 99 mg/dL 179    ?BUN 8 - 23 mg/dL 14    ?Creatinine 0.44 - 1.00 mg/dL 0.86    ?Sodium 135 - 145 mmol/L 139    ?Potassium 3.5 - 5.1 mmol/L 4.6    ?Chloride 98 - 111 mmol/L 106    ?CO2 22 - 32 mmol/L 23    ?Calcium 8.9 - 10.3 mg/dL 9.8    ?Total Protein 6.5 - 8.1 g/dL 7.5    ?Total Bilirubin 0.3 - 1.2 mg/dL 0.4    ?Alkaline Phos 38 - 126 U/L 79    ?AST 15 - 41 U/L 26    ?ALT 0 - 44 U/L 28    ? ? ?Lab Results  ?Component Value Date  ? WBC 7.9 09/14/2018  ? HGB 14.4 09/14/2018  ? HCT 47.1 (H) 09/14/2018  ? MCV 94.6 09/14/2018  ? PLT 364 09/14/2018  ? NEUTROABS 4.2 09/14/2018  ? ? ?ASSESSMENT & PLAN:  ?Ductal carcinoma in situ (DCIS) of right breast ?08/17/2018:Screening mammogram [facility] detected distortion in the right breast 12 o'clock position.  Biopsy revealed intermediate grade DCIS with calcifications, ER 100%, PR 100%, Tis NX stage 0 ?  ?09/22/2018:Left lumpectomy (Dr. Dalbert Batman): DCIS, 1.5cm, intermediate grade, margins negative, ER 100%, PR 100% Tis NX stage 0 ?  ?Recommendation: ?1.  Adjuvant radiation therapy (delayed due to COVID-19) ?2.  Other adjuvant antiestrogen therapy with tamoxifen x5 years.  Started 09/29/2018 discontinued December 2021 because of profound mood swings and hot flashes and depression ?All of the symptoms have improved. ?  ?Her son passed away in 10-04-2018 and her sister passed away in 2020/11/03 and another sister is in the process of dying. ?   ?Breast cancer surveillance: ?1.  Breast exam 11/07/2021: Benign ?2.  Mammograms: There is a report on care everywhere from [facility] that she had a mammogram in 10-03-2021.  However patient denies having had a mammogram.  We are calling [facility] to find out if this  belongs to someone else.  (09/03/2021 at [facility]: Benign stable postlumpectomy changes.  Breast density category B) ? ?If she did not get a mammogram then she needs to get a mammogram. ?1 episode of chest tightness: Because of her family history, we are contacting Dr. Woody Seller to discuss this with her and arrange for stress test if he felt it was necessary. ? ?Return to clinic in 1 year for follow up ? ? ? ?No orders of the  defined types were placed in this encounter. ? ?The patient has a good understanding of the overall plan. she agrees with it. she will call with any problems that may develop before the next visit here. ?Total time spent: 30 mins including face to face time and time spent for planning, charting and co-ordination of care ? ? Harriette Ohara, MD ?11/07/21 ? ? ? I Gardiner Coins am scribing for Dr. Lindi Adie ? ?I have reviewed the above documentation for accuracy and completeness, and I agree with the above. ?  ?

## 2021-10-26 DIAGNOSIS — E1169 Type 2 diabetes mellitus with other specified complication: Secondary | ICD-10-CM | POA: Diagnosis not present

## 2021-11-07 ENCOUNTER — Other Ambulatory Visit: Payer: Self-pay

## 2021-11-07 ENCOUNTER — Inpatient Hospital Stay: Payer: Medicare Other | Attending: Hematology and Oncology | Admitting: Hematology and Oncology

## 2021-11-07 DIAGNOSIS — Z923 Personal history of irradiation: Secondary | ICD-10-CM | POA: Insufficient documentation

## 2021-11-07 DIAGNOSIS — D0511 Intraductal carcinoma in situ of right breast: Secondary | ICD-10-CM | POA: Diagnosis not present

## 2021-11-07 DIAGNOSIS — Z79899 Other long term (current) drug therapy: Secondary | ICD-10-CM | POA: Insufficient documentation

## 2021-11-07 NOTE — Assessment & Plan Note (Addendum)
08/17/2018:Screening mammogram [facility] detected distortion in the right breast 12 o'clock position. ?Biopsy revealed intermediate grade DCIS with calcifications, ER 100%, PR 100%, Tis NX stage 0 ?? ?09/22/2018:Left lumpectomy (Dr. Dalbert Batman): DCIS, 1.5cm, intermediate grade, margins negative, ER 100%, PR 100%?Tis NX stage 0 ?? ?Recommendation: ?1.??Adjuvant radiation therapy?(delayed due to COVID-19) ?2.??Other adjuvant antiestrogen therapy with tamoxifen x5 years.??Started 09/29/2018 discontinued December 2021 because of profound mood swings and hot flashes and depression ?All of the symptoms have improved. ?? ?Her son passed away in 2018-09-15 and her sister passed away in 15-Oct-2020 and another sister is in the process of dying. ? ? ?Breast cancer surveillance: ?1.??Breast exam 11/07/2021: Benign ?2.??Mammograms: There is a report on care everywhere from [facility] that she had a mammogram in Sep 14, 2021.  However patient denies having had a mammogram.  We are calling [facility] to find out if this belongs to someone else.  (09/03/2021 at [facility]: Benign stable postlumpectomy changes.  Breast density category B) ? ?If she did not get a mammogram then she needs to get a mammogram. ?1 episode of chest tightness: Because of her family history, we are contacting Dr. Woody Seller to discuss this with her and arrange for stress test if he felt it was necessary. ? ?Return to clinic?in 1 year for follow up ?

## 2021-11-07 NOTE — Progress Notes (Signed)
Per direction of MD, I called [facility] regarding pt MM, left VM for return call to verify MM date as pt states otherwise.   ? ?I also called PCP office and scheduled appointment for today @ 2:45 to be assessed.  Pt notified of appt ?

## 2021-11-11 DIAGNOSIS — Z299 Encounter for prophylactic measures, unspecified: Secondary | ICD-10-CM | POA: Diagnosis not present

## 2021-11-11 DIAGNOSIS — R0789 Other chest pain: Secondary | ICD-10-CM | POA: Diagnosis not present

## 2021-11-11 DIAGNOSIS — Z713 Dietary counseling and surveillance: Secondary | ICD-10-CM | POA: Diagnosis not present

## 2021-11-11 DIAGNOSIS — I10 Essential (primary) hypertension: Secondary | ICD-10-CM | POA: Diagnosis not present

## 2021-11-25 DIAGNOSIS — E1169 Type 2 diabetes mellitus with other specified complication: Secondary | ICD-10-CM | POA: Diagnosis not present

## 2021-12-01 DIAGNOSIS — H40013 Open angle with borderline findings, low risk, bilateral: Secondary | ICD-10-CM | POA: Diagnosis not present

## 2021-12-01 DIAGNOSIS — H5319 Other subjective visual disturbances: Secondary | ICD-10-CM | POA: Diagnosis not present

## 2021-12-01 DIAGNOSIS — E113292 Type 2 diabetes mellitus with mild nonproliferative diabetic retinopathy without macular edema, left eye: Secondary | ICD-10-CM | POA: Diagnosis not present

## 2021-12-01 DIAGNOSIS — H353131 Nonexudative age-related macular degeneration, bilateral, early dry stage: Secondary | ICD-10-CM | POA: Diagnosis not present

## 2021-12-05 DIAGNOSIS — R079 Chest pain, unspecified: Secondary | ICD-10-CM | POA: Diagnosis not present

## 2021-12-05 DIAGNOSIS — R0602 Shortness of breath: Secondary | ICD-10-CM | POA: Diagnosis not present

## 2021-12-17 ENCOUNTER — Ambulatory Visit: Payer: Medicare Other | Admitting: Interventional Cardiology

## 2021-12-17 ENCOUNTER — Encounter: Payer: Self-pay | Admitting: Interventional Cardiology

## 2021-12-17 VITALS — BP 140/80 | HR 63 | Ht 61.5 in | Wt 176.0 lb

## 2021-12-17 DIAGNOSIS — Z789 Other specified health status: Secondary | ICD-10-CM | POA: Diagnosis not present

## 2021-12-17 DIAGNOSIS — R0789 Other chest pain: Secondary | ICD-10-CM

## 2021-12-17 DIAGNOSIS — I10 Essential (primary) hypertension: Secondary | ICD-10-CM | POA: Diagnosis not present

## 2021-12-17 DIAGNOSIS — E119 Type 2 diabetes mellitus without complications: Secondary | ICD-10-CM

## 2021-12-17 NOTE — Patient Instructions (Signed)
Medication Instructions:  Your physician recommends that you continue on your current medications as directed. Please refer to the Current Medication list given to you today.  *If you need a refill on your cardiac medications before your next appointment, please call your pharmacy*   Lab Work: none If you have labs (blood work) drawn today and your tests are completely normal, you will receive your results only by: [place] (if you have MyChart) OR A paper copy in the mail If you have any lab test that is abnormal or we need to change your treatment, we will call you to review the results.   Testing/Procedures: none   Follow-Up: At [facility], you and your health needs are our priority.  As part of our continuing mission to provide you with exceptional heart care, we have created designated Provider Care Teams.  These Care Teams include your primary Cardiologist (physician) and Advanced Practice Providers (APPs -  Physician Assistants and Nurse Practitioners) who all work together to provide you with the care you need, when you need it.  We recommend signing up for the patient portal called "MyChart".  Sign up information is provided on this After Visit Summary.  MyChart is used to connect with patients for Virtual Visits (Telemedicine).  Patients are able to view lab/test results, encounter notes, upcoming appointments, etc.  Non-urgent messages can be sent to your provider as well.   To learn more about what you can do with MyChart, go to [email or web address].    Your next appointment:   As needed  The format for your next appointment:   In Person  Provider:   Larae Grooms, MD     Other Instructions  Your provider recommends that you maintain 150 minutes per week of moderate aerobic activity.   Important Information About Sugar

## 2021-12-17 NOTE — Progress Notes (Signed)
Cardiology Office Note   Date:  12/17/2021   ID:  Carol Wood, DOB 28-Jan-1946, MRN [id number]  PCP:  Glenda Chroman, MD    No chief complaint on file.  Chest tightness  Wt Readings from Last 3 Encounters:  12/17/21 176 lb (79.8 kg)  11/07/21 175 lb 14.4 oz (79.8 kg)  11/07/20 176 lb 6.4 oz (80 kg)       History of Present Illness: Carol Wood is a 76 y.o. female who is being seen today for the evaluation of chest tightness at the request of Monico Blitz, MD. 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  She has had stress at home.  SHe had an episode of tightness that resolved with deep breathing.  She was checking the mail.  Sx lasted < 1 minute.  No recurrence of sx.   Stress test was negative 6/9: There is a small in size, mild in severity, fixed defect involving the apicolateral segment. No corresponding wall motion abnormality. No pharmacologically induced myocardial reversibility.  2. Normal left ventricular wall motion.  3. Left ventricular ejection fraction 70%  4. Non invasive risk stratification*: Low  Does not exercise.  She has completed treatment for BrCA- DCIS.    Past Medical History:  Diagnosis Date   Anemia    Cancer ([place])    Skin   Depressive disorder, not elsewhere classified    History of hiatal hernia    Hypothyroidism    Pneumonia    Pure hypercholesterolemia    Stroke ([place])    TIA   TIA (transient ischemic attack)    Type II or unspecified type diabetes mellitus without mention of complication, not stated as uncontrolled    Unspecified essential hypertension     Past Surgical History:  Procedure Laterality Date   BACK SURGERY     BIOPSY  10/10/2020   Procedure: BIOPSY;  Surgeon: Rogene Houston, MD;  [facility];  Service: Endoscopy;;  ge junction bx, Lower : hepatic flexure polyp cs times 2   BREAST LUMPECTOMY WITH RADIOACTIVE SEED LOCALIZATION Right 09/22/2018   Procedure:  RIGHT BREAST LUMPECTOMY WITH RADIOACTIVE SEED LOCALIZATION;  Surgeon: Fanny Skates, MD;  Location: [place];  Service: General;  Laterality: Right;   CATARACT EXTRACTION, BILATERAL     Laser   COLONOSCOPY     COLONOSCOPY N/A 07/31/2015   Procedure: COLONOSCOPY;  Surgeon: Rogene Houston, MD;  [facility];  Service: Endoscopy;  Laterality: N/A;  1200 - moved to 2/1 @ 10:30   COLONOSCOPY N/A 10/10/2020   Procedure: COLONOSCOPY;  Surgeon: Rogene Houston, MD;  [facility];  Service: Endoscopy;  Laterality: N/A;  am   ESOPHAGEAL DILATION N/A 09/06/2014   Procedure: ESOPHAGEAL DILATION;  Surgeon: Rogene Houston, MD;  [facility];  Service: Endoscopy;  Laterality: N/A;   ESOPHAGOGASTRODUODENOSCOPY N/A 09/06/2014   Procedure: ESOPHAGOGASTRODUODENOSCOPY (EGD);  Surgeon: Rogene Houston, MD;  [facility];  Service: Endoscopy;  Laterality: N/A;  220   ESOPHAGOGASTRODUODENOSCOPY N/A 10/10/2020   Procedure: ESOPHAGOGASTRODUODENOSCOPY (EGD);  Surgeon: Rogene Houston, MD;  [facility];  Service: Endoscopy;  Laterality: N/A;  am   EYE SURGERY     POLYPECTOMY  10/10/2020   Procedure: POLYPECTOMY;  Surgeon: Rogene Houston, MD;  [facility];  Service: Endoscopy;;   TUBAL LIGATION       Current Outpatient Medications  Medication Sig Dispense Refill   ACCU-CHEK SOFTCLIX LANCETS lancets      aspirin 325 MG tablet Take 1 tablet (325 mg total) by mouth every evening.     b complex vitamins tablet Take 1 tablet by mouth every evening.     carboxymethylcellul-glycerin (LUBRICANT DROPS/DUAL-ACTION) 0.5-0.9 % ophthalmic solution Place 1 drop into both eyes in the morning and at bedtime.     Cholecalciferol (VITAMIN D) 50 MCG (2000 UT) CAPS Take 4,000 Units by mouth every evening.     Cyanocobalamin (VITAMIN B-12) 5000 MCG TBDP Take 5,000 mcg by mouth every evening.     Flaxseed, Linseed, (FLAXSEED OIL) 1400 MG CAPS Take 1,400 mg by mouth in the  morning and at bedtime.     glipiZIDE (GLUCOTROL XL) 10 MG 24 hr tablet Take 10 mg by mouth 2 (two) times daily.     Glucosamine HCl 1500 MG TABS Take 1,500 mg by mouth in the morning and at bedtime.     ibuprofen (ADVIL) 200 MG tablet Take 800 mg by mouth daily as needed (pain).     insulin glargine (LANTUS) 100 UNIT/ML injection Inject 25 Units into the skin in the morning.     levothyroxine (SYNTHROID, LEVOTHROID) 75 MCG tablet Take 75 mcg by mouth daily before breakfast.      lisinopril (PRINIVIL,ZESTRIL) 20 MG tablet Take 20 mg by mouth 2 (two) times daily.      Magnesium 250 MG TABS Take 250 mg by mouth every evening.     metoprolol succinate (TOPROL-XL) 50 MG 24 hr tablet Take 50 mg by mouth in the morning.     Multiple Vitamin (MULTIVITAMIN WITH MINERALS) TABS tablet Take 1 tablet by mouth every evening.     Multiple Vitamins-Minerals (VITEYES AREDS FORMULA PO) Take 1 tablet by mouth 2 (two) times daily.     pantoprazole (PROTONIX) 40 MG tablet Take 40 mg by mouth at bedtime.      No current facility-administered medications for this visit.    Allergies:   Metformin and related, Tamoxifen, and Janumet [sitagliptin-metformin hcl]    Social History:  The patient  reports that she has never smoked. She has never used smokeless tobacco. She reports that she does not drink alcohol and does not use drugs.   Family History:  The patient's family history includes Cancer (age of onset: 34) in her mother; Colon cancer in her brother; Heart disease (age of onset: 47) in her son; Heart disease (age of onset: 61) in her son.    ROS:  Please see the history of present illness.   Otherwise, review of systems are positive for chest pain.   All other systems are reviewed and negative.    PHYSICAL EXAM: VS:  BP 140/80   Pulse 63   Ht 5' 1.5" (1.562 m)   Wt 176 lb (79.8 kg)   SpO2 97%   BMI 32.72 kg/m  , BMI Body mass index is 32.72 kg/m. GEN: Well nourished, well developed, in no acute  distress HEENT: normal Neck: no JVD, carotid bruits, or masses Cardiac: RRR; no murmurs, rubs, or gallops,no edema  Respiratory:  clear to auscultation bilaterally, normal work of breathing GI: soft, nontender, nondistended, + BS MS: no deformity or atrophy Skin: warm and dry, no rash Neuro:  Strength and sensation are intact Psych: euthymic mood, full affect   EKG:   The ekg ordered today demonstrates NSR, no ST changes   Recent Labs: No results found for requested labs within  last 365 days.   Lipid Panel No results found for: "CHOL", "TRIG", "HDL", "CHOLHDL", "VLDL", "LDLCALC", "LDLDIRECT"   Other studies Reviewed: Additional studies/ records that were reviewed today with results demonstrating: stress test reviewed.   ASSESSMENT AND PLAN:  Chest tightness: Negative stress test.  No recurrent sx.  Continue preventive therapy. Had a lot of emotional stress as well.  Taking aspirin 162.  Did not tolerate Plavix in the past.   DM: 7.5 in 09/2021.  High fiber diet.  HTN: The current medical regimen is effective;  continue present plan and medications.  If the blood pressure stays high, would consider switching metoprolol to carvedilo for better blood pressure lowering effect.. Statin intolerant- has tried many statins and had weakness and muscle pains.  Whole food, plant-based diet. Avoid processed foods. eat a high-fiber diet..   Current medicines are reviewed at length with the patient today.  The patient concerns regarding her medicines were addressed.  The following changes have been made:  No change  Labs/ tests ordered today include:  No orders of the defined types were placed in this encounter.   Recommend 150 minutes/week of aerobic exercise Low fat, low carb, high fiber diet recommended  Disposition:   FU in 1 year   Signed, Larae Grooms, MD  12/17/2021 2:13 PM    [facility] [place], [place], [place]  [address] Phone: [phone]; Fax: [phone]

## 2021-12-25 DIAGNOSIS — E1169 Type 2 diabetes mellitus with other specified complication: Secondary | ICD-10-CM | POA: Diagnosis not present

## 2022-01-26 DIAGNOSIS — E1169 Type 2 diabetes mellitus with other specified complication: Secondary | ICD-10-CM | POA: Diagnosis not present

## 2022-02-03 DIAGNOSIS — I10 Essential (primary) hypertension: Secondary | ICD-10-CM | POA: Diagnosis not present

## 2022-02-03 DIAGNOSIS — Z299 Encounter for prophylactic measures, unspecified: Secondary | ICD-10-CM | POA: Diagnosis not present

## 2022-02-03 DIAGNOSIS — N1831 Chronic kidney disease, stage 3a: Secondary | ICD-10-CM | POA: Diagnosis not present

## 2022-02-03 DIAGNOSIS — I779 Disorder of arteries and arterioles, unspecified: Secondary | ICD-10-CM | POA: Diagnosis not present

## 2022-02-03 DIAGNOSIS — E1165 Type 2 diabetes mellitus with hyperglycemia: Secondary | ICD-10-CM | POA: Diagnosis not present

## 2022-02-03 DIAGNOSIS — E1122 Type 2 diabetes mellitus with diabetic chronic kidney disease: Secondary | ICD-10-CM | POA: Diagnosis not present

## 2022-02-25 DIAGNOSIS — E1169 Type 2 diabetes mellitus with other specified complication: Secondary | ICD-10-CM | POA: Diagnosis not present

## 2022-03-27 DIAGNOSIS — E1169 Type 2 diabetes mellitus with other specified complication: Secondary | ICD-10-CM | POA: Diagnosis not present

## 2022-04-06 DIAGNOSIS — D692 Other nonthrombocytopenic purpura: Secondary | ICD-10-CM | POA: Diagnosis not present

## 2022-04-06 DIAGNOSIS — U071 COVID-19: Secondary | ICD-10-CM | POA: Diagnosis not present

## 2022-04-27 DIAGNOSIS — E1169 Type 2 diabetes mellitus with other specified complication: Secondary | ICD-10-CM | POA: Diagnosis not present

## 2022-05-04 DIAGNOSIS — R413 Other amnesia: Secondary | ICD-10-CM | POA: Diagnosis not present

## 2022-05-04 DIAGNOSIS — Z79899 Other long term (current) drug therapy: Secondary | ICD-10-CM | POA: Diagnosis not present

## 2022-05-04 DIAGNOSIS — I779 Disorder of arteries and arterioles, unspecified: Secondary | ICD-10-CM | POA: Diagnosis not present

## 2022-05-04 DIAGNOSIS — Z789 Other specified health status: Secondary | ICD-10-CM | POA: Diagnosis not present

## 2022-05-04 DIAGNOSIS — Z299 Encounter for prophylactic measures, unspecified: Secondary | ICD-10-CM | POA: Diagnosis not present

## 2022-05-04 DIAGNOSIS — I10 Essential (primary) hypertension: Secondary | ICD-10-CM | POA: Diagnosis not present

## 2022-05-04 DIAGNOSIS — E039 Hypothyroidism, unspecified: Secondary | ICD-10-CM | POA: Diagnosis not present

## 2022-05-04 DIAGNOSIS — Z7189 Other specified counseling: Secondary | ICD-10-CM | POA: Diagnosis not present

## 2022-05-04 DIAGNOSIS — R5383 Other fatigue: Secondary | ICD-10-CM | POA: Diagnosis not present

## 2022-05-04 DIAGNOSIS — Z0000 Encounter for general adult medical examination without abnormal findings: Secondary | ICD-10-CM | POA: Diagnosis not present

## 2022-05-04 DIAGNOSIS — E7800 Pure hypercholesterolemia, unspecified: Secondary | ICD-10-CM | POA: Diagnosis not present

## 2022-05-06 DIAGNOSIS — R413 Other amnesia: Secondary | ICD-10-CM | POA: Diagnosis not present

## 2022-05-11 ENCOUNTER — Encounter (INDEPENDENT_AMBULATORY_CARE_PROVIDER_SITE_OTHER): Payer: Self-pay | Admitting: Gastroenterology

## 2022-05-22 DIAGNOSIS — R413 Other amnesia: Secondary | ICD-10-CM | POA: Diagnosis not present

## 2022-05-22 DIAGNOSIS — Z299 Encounter for prophylactic measures, unspecified: Secondary | ICD-10-CM | POA: Diagnosis not present

## 2022-05-22 DIAGNOSIS — I10 Essential (primary) hypertension: Secondary | ICD-10-CM | POA: Diagnosis not present

## 2022-05-27 DIAGNOSIS — E1169 Type 2 diabetes mellitus with other specified complication: Secondary | ICD-10-CM | POA: Diagnosis not present

## 2022-06-10 DIAGNOSIS — Z299 Encounter for prophylactic measures, unspecified: Secondary | ICD-10-CM | POA: Diagnosis not present

## 2022-06-10 DIAGNOSIS — E1165 Type 2 diabetes mellitus with hyperglycemia: Secondary | ICD-10-CM | POA: Diagnosis not present

## 2022-06-10 DIAGNOSIS — I10 Essential (primary) hypertension: Secondary | ICD-10-CM | POA: Diagnosis not present

## 2022-06-26 DIAGNOSIS — E1169 Type 2 diabetes mellitus with other specified complication: Secondary | ICD-10-CM | POA: Diagnosis not present

## 2022-07-02 DIAGNOSIS — H40013 Open angle with borderline findings, low risk, bilateral: Secondary | ICD-10-CM | POA: Diagnosis not present

## 2022-07-27 DIAGNOSIS — E1169 Type 2 diabetes mellitus with other specified complication: Secondary | ICD-10-CM | POA: Diagnosis not present

## 2022-08-26 DIAGNOSIS — E1169 Type 2 diabetes mellitus with other specified complication: Secondary | ICD-10-CM | POA: Diagnosis not present

## 2022-09-08 DIAGNOSIS — Z1231 Encounter for screening mammogram for malignant neoplasm of breast: Secondary | ICD-10-CM | POA: Diagnosis not present

## 2022-09-26 DIAGNOSIS — E1169 Type 2 diabetes mellitus with other specified complication: Secondary | ICD-10-CM | POA: Diagnosis not present

## 2022-10-01 ENCOUNTER — Other Ambulatory Visit (HOSPITAL_COMMUNITY): Payer: Self-pay

## 2022-10-01 DIAGNOSIS — Z299 Encounter for prophylactic measures, unspecified: Secondary | ICD-10-CM | POA: Diagnosis not present

## 2022-10-01 DIAGNOSIS — I10 Essential (primary) hypertension: Secondary | ICD-10-CM | POA: Diagnosis not present

## 2022-10-01 DIAGNOSIS — E1165 Type 2 diabetes mellitus with hyperglycemia: Secondary | ICD-10-CM | POA: Diagnosis not present

## 2022-10-02 ENCOUNTER — Other Ambulatory Visit (HOSPITAL_COMMUNITY): Payer: Self-pay

## 2022-10-02 MED ORDER — METOPROLOL SUCCINATE ER 50 MG PO TB24
50.0000 mg | ORAL_TABLET | Freq: Every day | ORAL | 3 refills | Status: DC
  Filled 2022-10-05: qty 90, 90d supply, fill #0
  Filled 2022-11-10: qty 80, 80d supply, fill #0

## 2022-10-02 MED ORDER — LISINOPRIL 20 MG PO TABS
20.0000 mg | ORAL_TABLET | Freq: Two times a day (BID) | ORAL | 4 refills | Status: DC
  Filled 2022-10-08: qty 180, 90d supply, fill #0
  Filled 2023-01-01: qty 180, 90d supply, fill #1
  Filled 2023-04-01: qty 180, 90d supply, fill #2
  Filled 2023-05-25: qty 180, 90d supply, fill #3
  Filled 2023-06-25: qty 160, 80d supply, fill #3

## 2022-10-05 ENCOUNTER — Other Ambulatory Visit (HOSPITAL_COMMUNITY): Payer: Self-pay

## 2022-10-05 ENCOUNTER — Other Ambulatory Visit: Payer: Self-pay

## 2022-10-05 MED ORDER — PANTOPRAZOLE SODIUM 40 MG PO TBEC
40.0000 mg | DELAYED_RELEASE_TABLET | Freq: Every day | ORAL | 3 refills | Status: DC
  Filled 2022-10-05: qty 90, 90d supply, fill #0
  Filled 2023-01-01: qty 80, 80d supply, fill #1

## 2022-10-05 MED ORDER — ACCU-CHEK GUIDE W/DEVICE KIT
PACK | 3 refills | Status: DC
  Filled 2022-10-05: qty 1, 30d supply, fill #0
  Filled 2022-10-05: qty 1, fill #0

## 2022-10-05 MED ORDER — GLIPIZIDE ER 10 MG PO TB24
10.0000 mg | ORAL_TABLET | Freq: Two times a day (BID) | ORAL | 3 refills | Status: DC
  Filled 2022-10-05: qty 180, 90d supply, fill #0
  Filled 2023-01-01: qty 160, 80d supply, fill #1

## 2022-10-05 MED ORDER — INSULIN PEN NEEDLE 31G X 5 MM MISC
1.0000 | Freq: Two times a day (BID) | 4 refills | Status: DC
  Filled 2022-10-05: qty 200, 100d supply, fill #0
  Filled 2023-01-01: qty 200, 100d supply, fill #1

## 2022-10-05 MED ORDER — LEVOTHYROXINE SODIUM 75 MCG PO TABS
75.0000 ug | ORAL_TABLET | Freq: Every day | ORAL | 4 refills | Status: DC
  Filled 2022-10-05: qty 90, 90d supply, fill #0
  Filled 2023-01-01: qty 90, 90d supply, fill #1
  Filled 2023-05-25: qty 80, 80d supply, fill #2

## 2022-10-05 MED ORDER — TRUEPLUS LANCETS 28G MISC
1.0000 | Freq: Two times a day (BID) | 4 refills | Status: DC
  Filled 2022-10-05: qty 200, 100d supply, fill #0
  Filled 2022-10-05: qty 204, 100d supply, fill #0
  Filled 2022-10-08 – 2023-01-01 (×2): qty 200, fill #0

## 2022-10-05 MED ORDER — GLUCOSE BLOOD VI STRP
1.0000 | ORAL_STRIP | Freq: Two times a day (BID) | 3 refills | Status: DC
  Filled 2022-10-05 (×2): qty 200, 100d supply, fill #0

## 2022-10-05 MED ORDER — INSULIN GLARGINE 100 UNIT/ML SOLOSTAR PEN
40.0000 [IU] | PEN_INJECTOR | Freq: Every morning | SUBCUTANEOUS | 4 refills | Status: DC
  Filled 2022-10-05: qty 39, 97d supply, fill #0

## 2022-10-06 ENCOUNTER — Other Ambulatory Visit: Payer: Self-pay

## 2022-10-06 ENCOUNTER — Other Ambulatory Visit (HOSPITAL_COMMUNITY): Payer: Self-pay

## 2022-10-07 ENCOUNTER — Other Ambulatory Visit (HOSPITAL_COMMUNITY): Payer: Self-pay

## 2022-10-08 ENCOUNTER — Other Ambulatory Visit (HOSPITAL_COMMUNITY): Payer: Self-pay

## 2022-10-08 ENCOUNTER — Other Ambulatory Visit: Payer: Self-pay

## 2022-10-08 MED ORDER — ONETOUCH ULTRA VI LIQD
3 refills | Status: DC
  Filled 2022-10-08: qty 300, fill #0

## 2022-10-08 MED ORDER — ACCU-CHEK GUIDE W/DEVICE KIT
PACK | 0 refills | Status: DC
  Filled 2022-10-08: qty 1, 30d supply, fill #0

## 2022-10-08 MED ORDER — GLUCOSE BLOOD VI STRP
1.0000 | ORAL_STRIP | Freq: Three times a day (TID) | 3 refills | Status: DC | PRN
  Filled 2022-10-08: qty 300, 100d supply, fill #0
  Filled 2023-01-01: qty 300, 100d supply, fill #1
  Filled 2023-01-25 – 2023-05-25 (×2): qty 300, 100d supply, fill #2

## 2022-10-08 MED ORDER — LEVOTHYROXINE SODIUM 75 MCG PO TABS: 75.0000 ug | ORAL_TABLET | Freq: Every day | ORAL | 3 refills | Status: DC

## 2022-10-08 MED ORDER — ONETOUCH DELICA PLUS LANCET33G MISC
1.0000 | Freq: Three times a day (TID) | 0 refills | Status: DC
  Filled 2022-10-08: qty 300, 100d supply, fill #0

## 2022-10-08 MED ORDER — ONETOUCH VERIO REFLECT W/DEVICE KIT
PACK | 0 refills | Status: DC
  Filled 2022-10-08: qty 1, 30d supply, fill #0

## 2022-10-09 ENCOUNTER — Other Ambulatory Visit (HOSPITAL_COMMUNITY): Payer: Self-pay

## 2022-10-09 ENCOUNTER — Other Ambulatory Visit: Payer: Self-pay

## 2022-10-27 DIAGNOSIS — E1169 Type 2 diabetes mellitus with other specified complication: Secondary | ICD-10-CM | POA: Diagnosis not present

## 2022-11-09 DIAGNOSIS — Z0000 Encounter for general adult medical examination without abnormal findings: Secondary | ICD-10-CM | POA: Diagnosis not present

## 2022-11-09 DIAGNOSIS — Z7189 Other specified counseling: Secondary | ICD-10-CM | POA: Diagnosis not present

## 2022-11-09 DIAGNOSIS — Z1339 Encounter for screening examination for other mental health and behavioral disorders: Secondary | ICD-10-CM | POA: Diagnosis not present

## 2022-11-09 DIAGNOSIS — E039 Hypothyroidism, unspecified: Secondary | ICD-10-CM | POA: Diagnosis not present

## 2022-11-09 DIAGNOSIS — Z6830 Body mass index (BMI) 30.0-30.9, adult: Secondary | ICD-10-CM | POA: Diagnosis not present

## 2022-11-09 DIAGNOSIS — I779 Disorder of arteries and arterioles, unspecified: Secondary | ICD-10-CM | POA: Diagnosis not present

## 2022-11-09 DIAGNOSIS — Z1331 Encounter for screening for depression: Secondary | ICD-10-CM | POA: Diagnosis not present

## 2022-11-09 DIAGNOSIS — I10 Essential (primary) hypertension: Secondary | ICD-10-CM | POA: Diagnosis not present

## 2022-11-09 DIAGNOSIS — Z299 Encounter for prophylactic measures, unspecified: Secondary | ICD-10-CM | POA: Diagnosis not present

## 2022-11-10 ENCOUNTER — Other Ambulatory Visit (HOSPITAL_COMMUNITY): Payer: Self-pay

## 2022-11-10 MED ORDER — DEXCOM G7 SENSOR MISC
4 refills | Status: DC
  Filled 2022-11-10 – 2022-11-23 (×2): qty 9, 90d supply, fill #0

## 2022-11-10 MED ORDER — DEXCOM G7 RECEIVER DEVI
1 refills | Status: DC
  Filled 2022-11-10: qty 1, 90d supply, fill #0
  Filled 2022-11-23: qty 1, 30d supply, fill #0

## 2022-11-10 NOTE — Progress Notes (Signed)
Patient Care Team: Ignatius Specking, MD as PCP - General (Internal Medicine) Corky Crafts, MD as PCP - Cardiology (Cardiology) Dorisann Frames, MD as Attending Physician (Endocrinology) Stanford Breed, MD as Referring Physician (Radiation Oncology) Serena Croissant, MD as Consulting Physician (Hematology and Oncology) Claud Kelp, MD as Consulting Physician (General Surgery)  DIAGNOSIS: No diagnosis found.  SUMMARY OF ONCOLOGIC HISTORY: Oncology History  Ductal carcinoma in situ (DCIS) of right breast  08/17/2018 Initial Diagnosis   Screening mammogram [facility] detected distortion in the right breast 12 o'clock position.  Biopsy revealed intermediate grade DCIS with calcifications, ER 100%, PR 100%, Tis NX stage 0   09/22/2018 Surgery   Left lumpectomy (Dr. Derrell Lolling): DCIS, 1.5cm, intermediate grade, margins negative, ER 100%, PR 100%.    09/22/2018 Cancer Staging   Staging form: Breast, AJCC 8th Edition - Pathologic stage from 09/22/2018: Stage 0 (pTis (DCIS), pN0, cM0, ER+, PR+) - Signed by Loa Socks, NP on 03/22/2019   09/29/2018 -  Anti-estrogen oral therapy   Tamoxifen, plan or 5 years.   12/05/2018 - 01/04/2019 Radiation Therapy   Adj RT at [facility] with Dr. Ernest Haber: Right whole breast 3D CRT 6 and 18 MV 266 cGy 16 4256 cGy 12/05/2018   12/27/2018  Lumpectomy boost 3D CRT 6 MV 200 cGy 5 1000 cGy 12/28/2018 01/04/2019      CHIEF COMPLIANT: Follow-up of right breast cancer surveillance    INTERVAL HISTORY: Carol Wood is a 77 y.o. with above-mentioned history of right breast cancer treated with lumpectomy, radiation, and is currently on tamoxifen. She presents to the clinic today for annual follow-up.    ALLERGIES:  is allergic to metformin and related, tamoxifen, and janumet [sitagliptin-metformin hcl].  MEDICATIONS:  Current Outpatient Medications  Medication Sig Dispense Refill   ACCU-CHEK SOFTCLIX LANCETS lancets      aspirin 325 MG tablet  Take 1 tablet (325 mg total) by mouth every evening.     b complex vitamins tablet Take 1 tablet by mouth every evening.     Blood Glucose Calibration (ONETOUCH ULTRA) LIQD Use 3 times daily as needed 300 each 3   Blood Glucose Monitoring Suppl (ACCU-CHEK GUIDE) w/Device KIT Use as directed 1 kit 3   Blood Glucose Monitoring Suppl (ACCU-CHEK GUIDE) w/Device KIT Use once daily as directed 1 kit 0   Blood Glucose Monitoring Suppl (ONETOUCH VERIO REFLECT) w/Device KIT Use as directed 1 kit 0   carboxymethylcellul-glycerin (LUBRICANT DROPS/DUAL-ACTION) 0.5-0.9 % ophthalmic solution Place 1 drop into both eyes in the morning and at bedtime.     Cholecalciferol (VITAMIN D) 50 MCG (2000 UT) CAPS Take 4,000 Units by mouth every evening.     Cyanocobalamin (VITAMIN B-12) 5000 MCG TBDP Take 5,000 mcg by mouth every evening.     Flaxseed, Linseed, (FLAXSEED OIL) 1400 MG CAPS Take 1,400 mg by mouth in the morning and at bedtime.     glipiZIDE (GLUCOTROL XL) 10 MG 24 hr tablet Take 10 mg by mouth 2 (two) times daily.     glipiZIDE (GLUCOTROL XL) 10 MG 24 hr tablet Take 1 tablet (10 mg total) by mouth 2 (two) times daily. 180 tablet 3   Glucosamine HCl 1500 MG TABS Take 1,500 mg by mouth in the morning and at bedtime.     glucose blood test strip Use to check blood glucose 2 (two) times daily 200 each 3   glucose blood test strip Use 3 (three) times daily as needed to test blood sugar 300  each 3   ibuprofen (ADVIL) 200 MG tablet Take 800 mg by mouth daily as needed (pain).     insulin glargine (LANTUS) 100 UNIT/ML injection Inject 25 Units into the skin in the morning.     insulin glargine (LANTUS) 100 UNIT/ML Solostar Pen Inject 25 Units into the skin in the morning and sliding titration scaleup to 40 units. Max 40 units a day 45 mL 4   Insulin Pen Needle 31G X 5 MM MISC Use 2 (two) times daily. 200 each 4   Lancets (ONETOUCH DELICA PLUS LANCET33G) MISC Use 3 (three) times daily as needed to check blood sugar.  300 each 0   levothyroxine (SYNTHROID) 75 MCG tablet Take 1 tablet (75 mcg total) by mouth daily. 90 tablet 4   levothyroxine (SYNTHROID) 75 MCG tablet Take 1 tablet (75 mcg total) by mouth daily. 30 tablet 3   levothyroxine (SYNTHROID, LEVOTHROID) 75 MCG tablet Take 75 mcg by mouth daily before breakfast.      lisinopril (PRINIVIL,ZESTRIL) 20 MG tablet Take 20 mg by mouth 2 (two) times daily.      lisinopril (ZESTRIL) 20 MG tablet Take 1 tablet (20 mg total) by mouth 2 (two) times daily. 180 tablet 4   Magnesium 250 MG TABS Take 250 mg by mouth every evening.     metoprolol succinate (TOPROL-XL) 50 MG 24 hr tablet Take 50 mg by mouth in the morning.     metoprolol succinate (TOPROL-XL) 50 MG 24 hr tablet Take 1 tablet (50 mg total) by mouth daily. 90 tablet 3   Multiple Vitamin (MULTIVITAMIN WITH MINERALS) TABS tablet Take 1 tablet by mouth every evening.     Multiple Vitamins-Minerals (VITEYES AREDS FORMULA PO) Take 1 tablet by mouth 2 (two) times daily.     pantoprazole (PROTONIX) 40 MG tablet Take 40 mg by mouth at bedtime.      pantoprazole (PROTONIX) 40 MG tablet Take 1 tablet (40 mg total) by mouth daily. 90 tablet 3   TRUEplus Lancets 28G MISC Inject 1 each into the skin 2 (two) times daily to check blood glucose 200 each 4   No current facility-administered medications for this visit.    PHYSICAL EXAMINATION: ECOG PERFORMANCE STATUS: {CHL ONC ECOG [id number]}  There were no vitals filed for this visit. There were no vitals filed for this visit.  BREAST:*** No palpable masses or nodules in either right or left breasts. No palpable axillary supraclavicular or infraclavicular adenopathy no breast tenderness or nipple discharge. (exam performed in the presence of a chaperone)  LABORATORY DATA:  I have reviewed the data as listed    Latest Ref Rng & Units 09/14/2018    9:52 AM  CMP  Glucose 70 - 99 mg/dL 629   BUN 8 - 23 mg/dL 14   Creatinine 5.28 - 1.00 mg/dL 4.13   Sodium  244 - 010 mmol/L 139   Potassium 3.5 - 5.1 mmol/L 4.6   Chloride 98 - 111 mmol/L 106   CO2 22 - 32 mmol/L 23   Calcium 8.9 - 10.3 mg/dL 9.8   Total Protein 6.5 - 8.1 g/dL 7.5   Total Bilirubin 0.3 - 1.2 mg/dL 0.4   Alkaline Phos 38 - 126 U/L 79   AST 15 - 41 U/L 26   ALT 0 - 44 U/L 28     Lab Results  Component Value Date   WBC 7.9 09/14/2018   HGB 14.4 09/14/2018   HCT 47.1 (H) 09/14/2018   MCV 94.6  09/14/2018   PLT 364 09/14/2018   NEUTROABS 4.2 09/14/2018    ASSESSMENT & PLAN:  No problem-specific Assessment & Plan notes found for this encounter.    No orders of the defined types were placed in this encounter.  The patient has a good understanding of the overall plan. she agrees with it. she will call with any problems that may develop before the next visit here. Total time spent: 30 mins including face to face time and time spent for planning, charting and co-ordination of care   Sherlyn Lick, CMA 11/10/22    I Janan Ridge am acting as a Neurosurgeon for [employer]  ***

## 2022-11-10 NOTE — Assessment & Plan Note (Signed)
08/17/2018:Screening mammogram [facility] detected distortion in the right breast 12 o'clock position.  Biopsy revealed intermediate grade DCIS with calcifications, ER 100%, PR 100%, Tis NX stage 0   09/22/2018:Left lumpectomy (Dr. Derrell Lolling): DCIS, 1.5cm, intermediate grade, margins negative, ER 100%, PR 100% Tis NX stage 0   Recommendation: 1.  Adjuvant radiation therapy (delayed due to COVID-19) 2.  Other adjuvant antiestrogen therapy with tamoxifen x5 years.  Started 09/29/2018 discontinued December 2021 because of profound mood swings and hot flashes and depression All of the symptoms have improved.   Her son passed away in 11/26/2018 and her sister passed away in 04-30-22and another sister is in the process of dying.    Breast cancer surveillance: 1.  Breast exam 11/07/2021: Benign 2.  Mammograms: 09/08/2022: Benign, density category B    Return to clinic in 1 year for follow up

## 2022-11-11 ENCOUNTER — Inpatient Hospital Stay: Payer: HMO | Attending: Hematology and Oncology | Admitting: Hematology and Oncology

## 2022-11-11 ENCOUNTER — Other Ambulatory Visit: Payer: Self-pay

## 2022-11-11 VITALS — BP 160/61 | HR 66 | Temp 97.7°F | Resp 18 | Ht 61.5 in | Wt 172.4 lb

## 2022-11-11 DIAGNOSIS — Z923 Personal history of irradiation: Secondary | ICD-10-CM | POA: Insufficient documentation

## 2022-11-11 DIAGNOSIS — Z170 Estrogen receptor positive status [ER+]: Secondary | ICD-10-CM | POA: Diagnosis not present

## 2022-11-11 DIAGNOSIS — D0511 Intraductal carcinoma in situ of right breast: Secondary | ICD-10-CM | POA: Insufficient documentation

## 2022-11-11 DIAGNOSIS — Z79810 Long term (current) use of selective estrogen receptor modulators (SERMs): Secondary | ICD-10-CM | POA: Diagnosis not present

## 2022-11-17 ENCOUNTER — Other Ambulatory Visit: Payer: Self-pay

## 2022-11-17 ENCOUNTER — Other Ambulatory Visit (HOSPITAL_COMMUNITY): Payer: Self-pay

## 2022-11-19 ENCOUNTER — Other Ambulatory Visit (HOSPITAL_COMMUNITY): Payer: Self-pay

## 2022-11-19 MED ORDER — DEXCOM G7 SENSOR MISC
4 refills | Status: DC
  Filled 2022-11-19: qty 9, 90d supply, fill #0

## 2022-11-19 MED ORDER — DEXCOM G7 RECEIVER DEVI
1 refills | Status: DC
  Filled 2022-11-19: qty 1, 90d supply, fill #0

## 2022-11-23 ENCOUNTER — Other Ambulatory Visit (HOSPITAL_COMMUNITY): Payer: Self-pay

## 2022-11-23 ENCOUNTER — Other Ambulatory Visit: Payer: Self-pay

## 2022-11-24 ENCOUNTER — Other Ambulatory Visit (HOSPITAL_COMMUNITY): Payer: Self-pay

## 2022-11-24 ENCOUNTER — Other Ambulatory Visit: Payer: Self-pay

## 2022-11-24 MED ORDER — ONETOUCH DELICA PLUS LANCET33G MISC
0 refills | Status: DC
  Filled 2022-11-24 – 2023-01-01 (×2): qty 300, 100d supply, fill #0

## 2022-11-25 ENCOUNTER — Other Ambulatory Visit (HOSPITAL_COMMUNITY): Payer: Self-pay

## 2022-11-26 ENCOUNTER — Other Ambulatory Visit: Payer: Self-pay

## 2022-11-27 DIAGNOSIS — E1169 Type 2 diabetes mellitus with other specified complication: Secondary | ICD-10-CM | POA: Diagnosis not present

## 2022-12-27 DIAGNOSIS — E1169 Type 2 diabetes mellitus with other specified complication: Secondary | ICD-10-CM | POA: Diagnosis not present

## 2023-01-01 ENCOUNTER — Other Ambulatory Visit: Payer: Self-pay

## 2023-01-01 ENCOUNTER — Other Ambulatory Visit (HOSPITAL_COMMUNITY): Payer: Self-pay

## 2023-01-01 MED ORDER — LANTUS SOLOSTAR 100 UNIT/ML ~~LOC~~ SOPN
40.0000 [IU] | PEN_INJECTOR | Freq: Every day | SUBCUTANEOUS | 12 refills | Status: DC
  Filled 2023-01-01: qty 15, 30d supply, fill #0
  Filled 2023-01-25: qty 15, 37d supply, fill #0

## 2023-01-02 ENCOUNTER — Other Ambulatory Visit (HOSPITAL_COMMUNITY): Payer: Self-pay

## 2023-01-04 ENCOUNTER — Other Ambulatory Visit: Payer: Self-pay

## 2023-01-04 ENCOUNTER — Other Ambulatory Visit (HOSPITAL_COMMUNITY): Payer: Self-pay

## 2023-01-04 DIAGNOSIS — H04123 Dry eye syndrome of bilateral lacrimal glands: Secondary | ICD-10-CM | POA: Diagnosis not present

## 2023-01-04 DIAGNOSIS — H353131 Nonexudative age-related macular degeneration, bilateral, early dry stage: Secondary | ICD-10-CM | POA: Diagnosis not present

## 2023-01-04 DIAGNOSIS — E119 Type 2 diabetes mellitus without complications: Secondary | ICD-10-CM | POA: Diagnosis not present

## 2023-01-04 DIAGNOSIS — H40013 Open angle with borderline findings, low risk, bilateral: Secondary | ICD-10-CM | POA: Diagnosis not present

## 2023-01-04 MED ORDER — METOPROLOL SUCCINATE ER 50 MG PO TB24
50.0000 mg | ORAL_TABLET | Freq: Every day | ORAL | 4 refills | Status: DC
  Filled 2023-01-25: qty 90, 90d supply, fill #0
  Filled 2023-05-25: qty 90, 90d supply, fill #1
  Filled 2023-08-23: qty 90, 90d supply, fill #2
  Filled 2023-10-06 – 2023-12-08 (×2): qty 90, 90d supply, fill #3

## 2023-01-09 ENCOUNTER — Other Ambulatory Visit (HOSPITAL_COMMUNITY): Payer: Self-pay

## 2023-01-14 DIAGNOSIS — E1165 Type 2 diabetes mellitus with hyperglycemia: Secondary | ICD-10-CM | POA: Diagnosis not present

## 2023-01-14 DIAGNOSIS — E1122 Type 2 diabetes mellitus with diabetic chronic kidney disease: Secondary | ICD-10-CM | POA: Diagnosis not present

## 2023-01-14 DIAGNOSIS — D692 Other nonthrombocytopenic purpura: Secondary | ICD-10-CM | POA: Diagnosis not present

## 2023-01-14 DIAGNOSIS — I10 Essential (primary) hypertension: Secondary | ICD-10-CM | POA: Diagnosis not present

## 2023-01-14 DIAGNOSIS — Z299 Encounter for prophylactic measures, unspecified: Secondary | ICD-10-CM | POA: Diagnosis not present

## 2023-01-14 DIAGNOSIS — N1831 Chronic kidney disease, stage 3a: Secondary | ICD-10-CM | POA: Diagnosis not present

## 2023-01-25 ENCOUNTER — Other Ambulatory Visit (HOSPITAL_COMMUNITY): Payer: Self-pay

## 2023-01-26 ENCOUNTER — Other Ambulatory Visit: Payer: Self-pay

## 2023-01-26 ENCOUNTER — Other Ambulatory Visit (HOSPITAL_COMMUNITY): Payer: Self-pay

## 2023-01-26 MED ORDER — ONETOUCH DELICA PLUS LANCET33G MISC
1.0000 | Freq: Three times a day (TID) | 0 refills | Status: AC | PRN
  Filled 2023-05-25: qty 300, 100d supply, fill #0

## 2023-01-27 DIAGNOSIS — E1169 Type 2 diabetes mellitus with other specified complication: Secondary | ICD-10-CM | POA: Diagnosis not present

## 2023-01-28 ENCOUNTER — Other Ambulatory Visit (HOSPITAL_COMMUNITY): Payer: Self-pay

## 2023-01-29 ENCOUNTER — Other Ambulatory Visit (HOSPITAL_COMMUNITY): Payer: Self-pay

## 2023-01-29 MED ORDER — LANTUS SOLOSTAR 100 UNIT/ML ~~LOC~~ SOPN
25.0000 [IU] | PEN_INJECTOR | Freq: Every morning | SUBCUTANEOUS | 12 refills | Status: DC
  Filled 2023-01-29 – 2023-02-10 (×2): qty 15, 60d supply, fill #0

## 2023-02-09 ENCOUNTER — Other Ambulatory Visit (HOSPITAL_COMMUNITY): Payer: Self-pay

## 2023-02-10 ENCOUNTER — Other Ambulatory Visit (HOSPITAL_COMMUNITY): Payer: Self-pay

## 2023-02-10 ENCOUNTER — Other Ambulatory Visit: Payer: Self-pay

## 2023-02-10 MED ORDER — LANTUS SOLOSTAR 100 UNIT/ML ~~LOC~~ SOPN
55.0000 [IU] | PEN_INJECTOR | Freq: Every day | SUBCUTANEOUS | 3 refills | Status: DC
  Filled 2023-02-10 – 2023-07-07 (×5): qty 45, 81d supply, fill #0
  Filled 2023-10-06: qty 54, 98d supply, fill #1

## 2023-02-19 ENCOUNTER — Other Ambulatory Visit (HOSPITAL_BASED_OUTPATIENT_CLINIC_OR_DEPARTMENT_OTHER): Payer: Self-pay

## 2023-02-19 ENCOUNTER — Other Ambulatory Visit (HOSPITAL_COMMUNITY): Payer: Self-pay

## 2023-02-27 DIAGNOSIS — E1169 Type 2 diabetes mellitus with other specified complication: Secondary | ICD-10-CM | POA: Diagnosis not present

## 2023-03-10 ENCOUNTER — Other Ambulatory Visit (HOSPITAL_COMMUNITY): Payer: Self-pay

## 2023-03-20 ENCOUNTER — Other Ambulatory Visit (HOSPITAL_COMMUNITY): Payer: Self-pay

## 2023-03-29 ENCOUNTER — Other Ambulatory Visit (HOSPITAL_COMMUNITY): Payer: Self-pay

## 2023-03-29 DIAGNOSIS — E1169 Type 2 diabetes mellitus with other specified complication: Secondary | ICD-10-CM | POA: Diagnosis not present

## 2023-04-01 ENCOUNTER — Other Ambulatory Visit (HOSPITAL_COMMUNITY): Payer: Self-pay

## 2023-04-01 ENCOUNTER — Other Ambulatory Visit: Payer: Self-pay

## 2023-04-01 MED ORDER — PANTOPRAZOLE SODIUM 40 MG PO TBEC
40.0000 mg | DELAYED_RELEASE_TABLET | Freq: Every day | ORAL | 3 refills | Status: DC
  Filled 2023-04-01: qty 90, 90d supply, fill #0
  Filled 2023-05-25 – 2023-06-25 (×2): qty 90, 90d supply, fill #1
  Filled 2023-10-06: qty 90, 90d supply, fill #2
  Filled 2024-01-24: qty 90, 90d supply, fill #3

## 2023-04-01 MED ORDER — GLIPIZIDE ER 10 MG PO TB24
10.0000 mg | ORAL_TABLET | Freq: Two times a day (BID) | ORAL | 4 refills | Status: DC
  Filled 2023-04-01: qty 180, 90d supply, fill #0
  Filled 2023-05-25 – 2023-06-25 (×2): qty 180, 90d supply, fill #1
  Filled 2023-10-06: qty 180, 90d supply, fill #2
  Filled 2024-01-07: qty 180, 90d supply, fill #3
  Filled 2024-03-03: qty 180, 90d supply, fill #4

## 2023-04-05 ENCOUNTER — Other Ambulatory Visit (HOSPITAL_COMMUNITY): Payer: Self-pay

## 2023-04-07 ENCOUNTER — Ambulatory Visit (INDEPENDENT_AMBULATORY_CARE_PROVIDER_SITE_OTHER): Payer: HMO | Admitting: Otolaryngology

## 2023-04-16 ENCOUNTER — Other Ambulatory Visit (HOSPITAL_COMMUNITY): Payer: Self-pay

## 2023-04-28 DIAGNOSIS — E1169 Type 2 diabetes mellitus with other specified complication: Secondary | ICD-10-CM | POA: Diagnosis not present

## 2023-05-07 DIAGNOSIS — I10 Essential (primary) hypertension: Secondary | ICD-10-CM | POA: Diagnosis not present

## 2023-05-07 DIAGNOSIS — E039 Hypothyroidism, unspecified: Secondary | ICD-10-CM | POA: Diagnosis not present

## 2023-05-07 DIAGNOSIS — E1122 Type 2 diabetes mellitus with diabetic chronic kidney disease: Secondary | ICD-10-CM | POA: Diagnosis not present

## 2023-05-07 DIAGNOSIS — Z299 Encounter for prophylactic measures, unspecified: Secondary | ICD-10-CM | POA: Diagnosis not present

## 2023-05-07 DIAGNOSIS — R5383 Other fatigue: Secondary | ICD-10-CM | POA: Diagnosis not present

## 2023-05-07 DIAGNOSIS — Z79899 Other long term (current) drug therapy: Secondary | ICD-10-CM | POA: Diagnosis not present

## 2023-05-07 DIAGNOSIS — E7800 Pure hypercholesterolemia, unspecified: Secondary | ICD-10-CM | POA: Diagnosis not present

## 2023-05-07 DIAGNOSIS — Z0000 Encounter for general adult medical examination without abnormal findings: Secondary | ICD-10-CM | POA: Diagnosis not present

## 2023-05-12 ENCOUNTER — Other Ambulatory Visit (HOSPITAL_COMMUNITY): Payer: Self-pay

## 2023-05-12 MED ORDER — REPATHA SURECLICK 140 MG/ML ~~LOC~~ SOAJ
1.0000 mL | SUBCUTANEOUS | 11 refills | Status: DC
Start: 2023-05-11 — End: 2024-05-08
  Filled 2023-05-12 – 2023-06-03 (×2): qty 2, 28d supply, fill #0
  Filled 2023-06-25: qty 2, 28d supply, fill #1
  Filled 2023-07-12: qty 2, 28d supply, fill #2
  Filled 2023-08-04: qty 2, 28d supply, fill #3
  Filled 2023-08-23 – 2023-09-08 (×2): qty 2, 28d supply, fill #4
  Filled 2023-10-06: qty 2, 28d supply, fill #5
  Filled 2023-10-30: qty 2, 28d supply, fill #6
  Filled 2023-11-27 – 2023-12-13 (×3): qty 2, 28d supply, fill #7
  Filled 2024-01-07 – 2024-01-09 (×2): qty 2, 28d supply, fill #8
  Filled 2024-02-03: qty 2, 28d supply, fill #9
  Filled 2024-03-02: qty 2, 28d supply, fill #10
  Filled 2024-04-01: qty 2, 28d supply, fill #11

## 2023-05-25 ENCOUNTER — Other Ambulatory Visit (HOSPITAL_COMMUNITY): Payer: Self-pay

## 2023-05-25 ENCOUNTER — Other Ambulatory Visit: Payer: Self-pay

## 2023-05-26 ENCOUNTER — Other Ambulatory Visit: Payer: Self-pay

## 2023-05-28 ENCOUNTER — Ambulatory Visit (INDEPENDENT_AMBULATORY_CARE_PROVIDER_SITE_OTHER): Payer: HMO

## 2023-05-28 ENCOUNTER — Other Ambulatory Visit: Payer: Self-pay

## 2023-05-31 ENCOUNTER — Ambulatory Visit (INDEPENDENT_AMBULATORY_CARE_PROVIDER_SITE_OTHER): Payer: HMO | Admitting: Audiology

## 2023-05-31 ENCOUNTER — Encounter (HOSPITAL_COMMUNITY): Payer: Self-pay

## 2023-05-31 ENCOUNTER — Ambulatory Visit (INDEPENDENT_AMBULATORY_CARE_PROVIDER_SITE_OTHER): Payer: HMO | Admitting: Otolaryngology

## 2023-05-31 ENCOUNTER — Other Ambulatory Visit (HOSPITAL_COMMUNITY): Payer: Self-pay

## 2023-05-31 ENCOUNTER — Encounter (INDEPENDENT_AMBULATORY_CARE_PROVIDER_SITE_OTHER): Payer: Self-pay

## 2023-05-31 VITALS — Ht 62.0 in | Wt 170.0 lb

## 2023-05-31 DIAGNOSIS — H6121 Impacted cerumen, right ear: Secondary | ICD-10-CM | POA: Diagnosis not present

## 2023-05-31 DIAGNOSIS — H903 Sensorineural hearing loss, bilateral: Secondary | ICD-10-CM
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
## 2023-06-01 ENCOUNTER — Other Ambulatory Visit (HOSPITAL_COMMUNITY): Payer: Self-pay

## 2023-06-02 ENCOUNTER — Other Ambulatory Visit: Payer: Self-pay

## 2023-06-03 ENCOUNTER — Other Ambulatory Visit (HOSPITAL_COMMUNITY): Payer: Self-pay

## 2023-06-25 ENCOUNTER — Other Ambulatory Visit: Payer: Self-pay

## 2023-07-04 ENCOUNTER — Other Ambulatory Visit (HOSPITAL_COMMUNITY): Payer: Self-pay

## 2023-07-05 ENCOUNTER — Other Ambulatory Visit: Payer: Self-pay

## 2023-07-06 ENCOUNTER — Other Ambulatory Visit (HOSPITAL_COMMUNITY): Payer: Self-pay

## 2023-07-06 MED ORDER — BLOOD GLUCOSE MONITOR SYSTEM W/DEVICE KIT
PACK | 0 refills | Status: DC
  Filled 2023-07-06 – 2023-07-12 (×2): qty 1, fill #0
  Filled 2023-07-13: qty 1, 30d supply, fill #0

## 2023-07-07 ENCOUNTER — Other Ambulatory Visit (HOSPITAL_COMMUNITY): Payer: Self-pay

## 2023-07-07 ENCOUNTER — Other Ambulatory Visit: Payer: Self-pay

## 2023-07-08 ENCOUNTER — Other Ambulatory Visit (HOSPITAL_COMMUNITY): Payer: Self-pay

## 2023-07-12 ENCOUNTER — Other Ambulatory Visit (HOSPITAL_COMMUNITY): Payer: Self-pay

## 2023-07-12 ENCOUNTER — Other Ambulatory Visit: Payer: Self-pay

## 2023-07-12 ENCOUNTER — Encounter (HOSPITAL_COMMUNITY): Payer: Self-pay

## 2023-07-13 ENCOUNTER — Other Ambulatory Visit: Payer: Self-pay

## 2023-07-15 ENCOUNTER — Other Ambulatory Visit: Payer: Self-pay

## 2023-07-19 ENCOUNTER — Other Ambulatory Visit: Payer: Self-pay

## 2023-08-05 ENCOUNTER — Other Ambulatory Visit (HOSPITAL_COMMUNITY): Payer: Self-pay

## 2023-08-05 DIAGNOSIS — H40013 Open angle with borderline findings, low risk, bilateral: Secondary | ICD-10-CM | POA: Diagnosis not present

## 2023-08-05 LAB — HM DIABETES EYE EXAM

## 2023-08-19 ENCOUNTER — Other Ambulatory Visit (HOSPITAL_COMMUNITY): Payer: Self-pay

## 2023-08-20 ENCOUNTER — Encounter: Payer: Self-pay | Admitting: Internal Medicine

## 2023-08-23 ENCOUNTER — Other Ambulatory Visit (HOSPITAL_COMMUNITY): Payer: Self-pay

## 2023-08-24 ENCOUNTER — Other Ambulatory Visit: Payer: Self-pay

## 2023-08-24 ENCOUNTER — Other Ambulatory Visit (HOSPITAL_COMMUNITY): Payer: Self-pay

## 2023-08-24 MED ORDER — LISINOPRIL 20 MG PO TABS
20.0000 mg | ORAL_TABLET | Freq: Two times a day (BID) | ORAL | 3 refills | Status: DC
  Filled 2023-08-24: qty 180, 90d supply, fill #0

## 2023-08-25 ENCOUNTER — Other Ambulatory Visit (HOSPITAL_COMMUNITY): Payer: Self-pay

## 2023-08-25 ENCOUNTER — Other Ambulatory Visit: Payer: Self-pay

## 2023-08-25 MED ORDER — LEVOTHYROXINE SODIUM 75 MCG PO TABS
75.0000 ug | ORAL_TABLET | Freq: Every day | ORAL | 3 refills | Status: AC
  Filled 2023-08-25: qty 90, 90d supply, fill #0
  Filled 2023-10-06 – 2023-12-08 (×2): qty 90, 90d supply, fill #1
  Filled 2024-03-03: qty 90, 90d supply, fill #2
  Filled 2024-05-07 – 2024-05-29 (×2): qty 90, 90d supply, fill #3

## 2023-08-31 ENCOUNTER — Other Ambulatory Visit: Payer: Self-pay

## 2023-09-07 ENCOUNTER — Other Ambulatory Visit: Payer: Self-pay

## 2023-09-07 DIAGNOSIS — N1831 Chronic kidney disease, stage 3a: Secondary | ICD-10-CM | POA: Diagnosis not present

## 2023-09-07 DIAGNOSIS — B689 Taeniasis, unspecified: Secondary | ICD-10-CM | POA: Diagnosis not present

## 2023-09-07 DIAGNOSIS — I10 Essential (primary) hypertension: Secondary | ICD-10-CM | POA: Diagnosis not present

## 2023-09-07 DIAGNOSIS — M545 Low back pain, unspecified: Secondary | ICD-10-CM | POA: Diagnosis not present

## 2023-09-07 DIAGNOSIS — Z299 Encounter for prophylactic measures, unspecified: Secondary | ICD-10-CM | POA: Diagnosis not present

## 2023-09-07 DIAGNOSIS — E1165 Type 2 diabetes mellitus with hyperglycemia: Secondary | ICD-10-CM | POA: Diagnosis not present

## 2023-09-09 ENCOUNTER — Other Ambulatory Visit (HOSPITAL_COMMUNITY): Payer: Self-pay

## 2023-09-10 ENCOUNTER — Other Ambulatory Visit (HOSPITAL_COMMUNITY): Payer: Self-pay

## 2023-09-10 ENCOUNTER — Other Ambulatory Visit: Payer: Self-pay

## 2023-09-10 MED ORDER — PRAZIQUANTEL 600 MG PO TABS
600.0000 mg | ORAL_TABLET | ORAL | 0 refills | Status: DC
  Filled 2023-09-10: qty 6, 6d supply, fill #0

## 2023-09-11 ENCOUNTER — Other Ambulatory Visit (HOSPITAL_COMMUNITY): Payer: Self-pay

## 2023-09-13 ENCOUNTER — Other Ambulatory Visit (HOSPITAL_COMMUNITY): Payer: Self-pay

## 2023-09-14 ENCOUNTER — Other Ambulatory Visit (HOSPITAL_COMMUNITY): Payer: Self-pay

## 2023-09-14 ENCOUNTER — Encounter (HOSPITAL_COMMUNITY): Payer: Self-pay

## 2023-09-14 DIAGNOSIS — Z1231 Encounter for screening mammogram for malignant neoplasm of breast: Secondary | ICD-10-CM | POA: Diagnosis not present

## 2023-09-14 MED ORDER — PRAZIQUANTEL 600 MG PO TABS: 600.0000 mg | ORAL_TABLET | Freq: Once | ORAL | 0 refills | Status: AC

## 2023-09-16 ENCOUNTER — Other Ambulatory Visit (HOSPITAL_COMMUNITY): Payer: Self-pay

## 2023-09-20 DIAGNOSIS — R928 Other abnormal and inconclusive findings on diagnostic imaging of breast: Secondary | ICD-10-CM | POA: Diagnosis not present

## 2023-09-20 DIAGNOSIS — R921 Mammographic calcification found on diagnostic imaging of breast: Secondary | ICD-10-CM | POA: Diagnosis not present

## 2023-09-23 DIAGNOSIS — I10 Essential (primary) hypertension: Secondary | ICD-10-CM | POA: Diagnosis not present

## 2023-09-23 DIAGNOSIS — N1831 Chronic kidney disease, stage 3a: Secondary | ICD-10-CM | POA: Diagnosis not present

## 2023-09-23 DIAGNOSIS — D692 Other nonthrombocytopenic purpura: Secondary | ICD-10-CM | POA: Diagnosis not present

## 2023-09-23 DIAGNOSIS — I779 Disorder of arteries and arterioles, unspecified: Secondary | ICD-10-CM | POA: Diagnosis not present

## 2023-09-23 DIAGNOSIS — Z299 Encounter for prophylactic measures, unspecified: Secondary | ICD-10-CM | POA: Diagnosis not present

## 2023-09-23 DIAGNOSIS — K610 Anal abscess: Secondary | ICD-10-CM | POA: Diagnosis not present

## 2023-09-27 ENCOUNTER — Other Ambulatory Visit: Payer: Self-pay | Admitting: *Deleted

## 2023-09-27 DIAGNOSIS — K610 Anal abscess: Secondary | ICD-10-CM

## 2023-09-30 ENCOUNTER — Ambulatory Visit: Admitting: General Surgery

## 2023-09-30 ENCOUNTER — Encounter: Payer: Self-pay | Admitting: General Surgery

## 2023-09-30 VITALS — BP 153/67 | HR 70 | Temp 98.0°F | Resp 16 | Ht 62.0 in | Wt 174.0 lb

## 2023-09-30 DIAGNOSIS — K610 Anal abscess: Secondary | ICD-10-CM

## 2023-09-30 NOTE — Progress Notes (Signed)
 Carol Wood; [id number]; Aug 28, 1945   HPI Patient is a 78 year old white female who was referred to my care by Dr. Sherril Croon for evaluation and treatment of a perianal abscess.  Patient states that she started having pain and swelling in the perianal region last month.  No drainage was noted.  She was started on an antibiotic and she has subsequently finished her antibiotic course yesterday.  She states the swelling and pain is less.  She denies any fevers.  She has never had this happen before.  She denies any perianal surgery. Past Medical History:  Diagnosis Date   Anemia    Cancer (HCC)    Skin   Depressive disorder, not elsewhere classified    History of hiatal hernia    Hypothyroidism    Pneumonia    Pure hypercholesterolemia    Stroke (HCC)    TIA   TIA (transient ischemic attack)    Type II or unspecified type diabetes mellitus without mention of complication, not stated as uncontrolled    Unspecified essential hypertension     Past Surgical History:  Procedure Laterality Date   BACK SURGERY     BIOPSY  10/10/2020   Procedure: BIOPSY;  Surgeon: Malissa Hippo, MD;  Location: [facility];  Service: Endoscopy;;  ge junction bx, Lower : hepatic flexure polyp cs times 2   BREAST LUMPECTOMY WITH RADIOACTIVE SEED LOCALIZATION Right 09/22/2018   Procedure: RIGHT BREAST LUMPECTOMY WITH RADIOACTIVE SEED LOCALIZATION;  Surgeon: Claud Kelp, MD;  Location: [facility] OR;  Service: General;  Laterality: Right;   CATARACT EXTRACTION, BILATERAL     Laser   COLONOSCOPY     COLONOSCOPY N/A 07/31/2015   Procedure: COLONOSCOPY;  Surgeon: Malissa Hippo, MD;  Location: [facility];  Service: Endoscopy;  Laterality: N/A;  1200 - moved to 2/1 @ 10:30   COLONOSCOPY N/A 10/10/2020   Procedure: COLONOSCOPY;  Surgeon: Malissa Hippo, MD;  Location: [facility];  Service: Endoscopy;  Laterality: N/A;  am   ESOPHAGEAL DILATION N/A 09/06/2014   Procedure: ESOPHAGEAL DILATION;  Surgeon: Malissa Hippo,  MD;  Location: [facility];  Service: Endoscopy;  Laterality: N/A;   ESOPHAGOGASTRODUODENOSCOPY N/A 09/06/2014   Procedure: ESOPHAGOGASTRODUODENOSCOPY (EGD);  Surgeon: Malissa Hippo, MD;  Location: [facility];  Service: Endoscopy;  Laterality: N/A;  220   ESOPHAGOGASTRODUODENOSCOPY N/A 10/10/2020   Procedure: ESOPHAGOGASTRODUODENOSCOPY (EGD);  Surgeon: Malissa Hippo, MD;  Location: [facility];  Service: Endoscopy;  Laterality: N/A;  am   EYE SURGERY     POLYPECTOMY  10/10/2020   Procedure: POLYPECTOMY;  Surgeon: Malissa Hippo, MD;  Location: [facility];  Service: Endoscopy;;   TUBAL LIGATION      Family History  Problem Relation Age of Onset   Cancer Mother 54       Lung   Colon cancer Brother    Heart disease Son 64       CABG   Heart disease Son 44       CABG    Current Outpatient Medications on File Prior to Visit  Medication Sig Dispense Refill   aspirin EC 81 MG tablet Take 81 mg by mouth daily. Swallow whole.     b complex vitamins tablet Take 1 tablet by mouth every evening.     Blood Glucose Monitoring Suppl (BLOOD GLUCOSE MONITOR SYSTEM) w/Device KIT Use as directed 1 kit 0   carboxymethylcellul-glycerin (LUBRICANT DROPS/DUAL-ACTION) 0.5-0.9 % ophthalmic solution Place 1 drop into both  eyes in the morning and at bedtime.     Cholecalciferol (VITAMIN D) 50 MCG (2000 UT) CAPS Take 4,000 Units by mouth every evening.     Cyanocobalamin (VITAMIN B-12) 5000 MCG TBDP Take 5,000 mcg by mouth every evening.     Evolocumab (REPATHA SURECLICK) 140 MG/ML SOAJ Inject 140 mg into the skin every 14 (fourteen) days. 2 mL 11   Flaxseed, Linseed, (FLAXSEED OIL) 1400 MG CAPS Take 1,400 mg by mouth in the morning and at bedtime.     glipiZIDE (GLUCOTROL XL) 10 MG 24 hr tablet Take 1 tablet (10 mg total) by mouth 2 (two) times daily. 180 tablet 4   Glucosamine HCl 1500 MG TABS Take 1,500 mg by mouth in the morning and at bedtime.     ibuprofen (ADVIL) 200 MG tablet Take 800 mg  by mouth daily as needed (pain).     insulin glargine (LANTUS SOLOSTAR) 100 UNIT/ML Solostar Pen Inject 35 Units into the skin daily as directed.  Use sliding titration with a max of up to 55 units daily. 45 mL 3   Insulin Pen Needle 31G X 5 MM MISC Use 2 (two) times daily. 200 each 4   Lancets (ONETOUCH DELICA PLUS LANCET33G) MISC Use 1 each 3 (three) times daily as needed. 300 each 0   levothyroxine (SYNTHROID) 75 MCG tablet Take 1 tablet (75 mcg total) by mouth daily. 90 tablet 3   lisinopril (ZESTRIL) 20 MG tablet Take 20 mg by mouth 2 (two) times daily.     Magnesium 250 MG TABS Take 250 mg by mouth every evening.     metoprolol succinate (TOPROL-XL) 50 MG 24 hr tablet Take 1 tablet (50 mg total) by mouth daily. 90 tablet 4   Multiple Vitamin (MULTIVITAMIN WITH MINERALS) TABS tablet Take 1 tablet by mouth every evening.     Multiple Vitamins-Minerals (VITEYES AREDS FORMULA PO) Take 1 tablet by mouth 2 (two) times daily.     pantoprazole (PROTONIX) 40 MG tablet Take 1 tablet (40 mg total) by mouth daily. 90 tablet 3   No current facility-administered medications on file prior to visit.    Allergies  Allergen Reactions   Metformin And Related Nausea And Vomiting   Tamoxifen Other (See Comments)    Joint pain/fatigue   Janumet [Sitagliptin-Metformin Hcl] Palpitations    Irregular heartbeat    Social History   Substance and Sexual Activity  Alcohol Use No   Alcohol/week: 0.0 standard drinks of alcohol    Social History   Tobacco Use  Smoking Status Never  Smokeless Tobacco Never    Review of Systems  Constitutional:  Positive for malaise/fatigue.  HENT:  Positive for sinus pain and sore throat.   Eyes: Negative.   Respiratory: Negative.    Cardiovascular: Negative.   Gastrointestinal: Negative.   Genitourinary: Negative.   Musculoskeletal:  Positive for back pain, joint pain and neck pain.  Neurological:  Positive for headaches.  Endo/Heme/Allergies: Negative.      Objective   Vitals:   09/30/23 1140  BP: (!) 153/67  Pulse: 70  Resp: 16  Temp: 98 F (36.7 C)  SpO2: 93%    Physical Exam Vitals reviewed.  Constitutional:      Appearance: Normal appearance. She is not ill-appearing.  HENT:     Head: Normocephalic and atraumatic.  Cardiovascular:     Rate and Rhythm: Normal rate and regular rhythm.     Heart sounds: Normal heart sounds. No murmur heard.  No friction rub. No gallop.  Pulmonary:     Effort: Pulmonary effort is normal. No respiratory distress.     Breath sounds: Normal breath sounds. No stridor. No wheezing, rhonchi or rales.  Genitourinary:    Comments: Patient has small external hemorrhoidal skin tags.  At the 9 o'clock position approximately 2 to 3 cm outside the anal verge, a small slightly indurated and minimally fluctuant boil is present.  No drainage is noted.  Minimal tenderness to touch noted. Skin:    General: Skin is warm and dry.  Neurological:     Mental Status: She is alert and oriented to person, place, and time.    Primary care notes reviewed Assessment  Perianal abscess, resolving Plan  I told the patient at this point there was no need for urgent surgical intervention.  She just finished her antibiotic course and it has been improving.  I will see her again in 1 month for follow-up.  Should it worsen in the meantime, she was instructed to return to my care.  She would like to do sitz bath's which is fine with me.

## 2023-10-06 ENCOUNTER — Other Ambulatory Visit (HOSPITAL_COMMUNITY): Payer: Self-pay

## 2023-10-07 ENCOUNTER — Other Ambulatory Visit (HOSPITAL_COMMUNITY): Payer: Self-pay

## 2023-10-07 ENCOUNTER — Other Ambulatory Visit: Payer: Self-pay

## 2023-10-07 MED ORDER — BD PEN NEEDLE MINI U/F 31G X 5 MM MISC
1.0000 | Freq: Two times a day (BID) | 3 refills | Status: AC
Start: 2023-10-07 — End: ?
  Filled 2023-10-07: qty 200, 100d supply, fill #0
  Filled 2024-01-07: qty 200, 100d supply, fill #1
  Filled 2024-05-07: qty 200, 100d supply, fill #2

## 2023-10-11 ENCOUNTER — Telehealth: Payer: Self-pay | Admitting: *Deleted

## 2023-10-11 MED ORDER — AMOXICILLIN-POT CLAVULANATE 875-125 MG PO TABS: 1.0000 | ORAL_TABLET | Freq: Two times a day (BID) | ORAL | 0 refills | Status: AC

## 2023-10-11 NOTE — Telephone Encounter (Signed)
 Received call from patient [phone]~ telephone/ [phone]~ telephone.  Patient reports that perianal abscess has started to fill back up and is uncomfortable. States that no drainage is noted, but area is becoming firm and tender to touch.   States that she continues warm compresses and sitz baths with little to no relief.   Requested ABTx to [facility].

## 2023-10-11 NOTE — Telephone Encounter (Signed)
 Discussed with Dr. Larrie Po and new orders obtained for ABTx: Augmentin 875/125 mg PO BID x7 days. Continue sitz baths.  F/U next week.   Call placed to patient and patient made aware. Agreeable to plan. Appointment scheduled. Rx sent to pharmacy.

## 2023-10-21 ENCOUNTER — Ambulatory Visit: Admitting: General Surgery

## 2023-10-21 ENCOUNTER — Encounter: Payer: Self-pay | Admitting: General Surgery

## 2023-10-21 VITALS — BP 166/64 | HR 72 | Temp 98.0°F | Resp 14 | Ht 62.0 in | Wt 173.0 lb

## 2023-10-21 DIAGNOSIS — K610 Anal abscess: Secondary | ICD-10-CM

## 2023-10-21 NOTE — Progress Notes (Signed)
 Subjective:     Carol Wood  Patient here for follow-up of perianal abscess.  She states it has resolved and she has not had any drainage or pain recently. Objective:    BP (!) 166/64   Pulse 72   Temp 98 F (36.7 C) (Oral)   Resp 14   Ht 5\' 2"  (1.575 m)   Wt 173 lb (78.5 kg)   SpO2 94%   BMI 31.64 kg/m   General:  alert, cooperative, and no distress  Rectal examination reveals resolved perianal abscess.  No induration is noted.  She does have some external hemorrhoidal skin tags.     Assessment:    Perianal abscess, resolved.  No need for further surgical intervention.    Plan:   Patient was instructed to return to my care should she have a recurrence of the perianal abscess.  Follow-up here as needed.

## 2023-10-28 ENCOUNTER — Ambulatory Visit: Admitting: General Surgery

## 2023-10-30 ENCOUNTER — Other Ambulatory Visit (HOSPITAL_COMMUNITY): Payer: Self-pay

## 2023-11-10 NOTE — Assessment & Plan Note (Signed)
 08/17/2018:Screening mammogram [facility] detected distortion in the right breast 12 o'clock position.  Biopsy revealed intermediate grade DCIS with calcifications, ER 100%, PR 100%, Tis NX stage 0   09/22/2018:Left lumpectomy (Dr. Lauralee Poll): DCIS, 1.5cm, intermediate grade, margins negative, ER 100%, PR 100% Tis NX stage 0   Recommendation: 1.  Adjuvant radiation therapy (delayed due to COVID-19) 2.  Other adjuvant antiestrogen therapy with tamoxifen  x5 years.  Started 09/29/2018 discontinued December 2021 because of profound mood swings and hot flashes and depression All of the symptoms have improved.   Her son passed away in 11/20/2018 and her sister passed away in 04/24/2022and another sister also passed away since then    Breast cancer surveillance: 1.  Breast exam 11/11/2023: Benign 2.  Mammograms: 09/08/2022: Benign, density category B     Return to clinic on an as-needed basis.

## 2023-11-11 ENCOUNTER — Inpatient Hospital Stay: Payer: HMO | Attending: Hematology and Oncology | Admitting: Hematology and Oncology

## 2023-11-11 ENCOUNTER — Other Ambulatory Visit: Payer: Self-pay | Admitting: *Deleted

## 2023-11-11 VITALS — BP 174/66 | HR 73 | Temp 98.3°F | Resp 16 | Ht 62.0 in | Wt 174.8 lb

## 2023-11-11 DIAGNOSIS — D0511 Intraductal carcinoma in situ of right breast: Secondary | ICD-10-CM | POA: Insufficient documentation

## 2023-11-11 NOTE — Progress Notes (Signed)
 Patient Care Team: Orlena Bitters, MD as PCP - General (Internal Medicine) Lucendia Rusk, MD as PCP - Cardiology (Cardiology) Tasia Farr, MD as Attending Physician (Endocrinology) Adonis Hoot, MD as Referring Physician (Radiation Oncology) Cameron Cea, MD as Consulting Physician (Hematology and Oncology) Boyce Byes, MD as Consulting Physician (General Surgery)  DIAGNOSIS:  Encounter Diagnosis  Name Primary?   Ductal carcinoma in situ (DCIS) of right breast Yes    SUMMARY OF ONCOLOGIC HISTORY: Oncology History  Ductal carcinoma in situ (DCIS) of right breast  08/17/2018 Initial Diagnosis   Screening mammogram [facility] detected distortion in the right breast 12 o'clock position.  Biopsy revealed intermediate grade DCIS with calcifications, ER 100%, PR 100%, Tis NX stage 0   09/22/2018 Surgery   Left lumpectomy (Dr. Lauralee Poll): DCIS, 1.5cm, intermediate grade, margins negative, ER 100%, PR 100%.    09/22/2018 Cancer Staging   Staging form: Breast, AJCC 8th Edition - Pathologic stage from 09/22/2018: Stage 0 (pTis (DCIS), pN0, cM0, ER+, PR+) - Signed by Percival Brace, NP on 03/22/2019   09/29/2018 -  Anti-estrogen oral therapy   Tamoxifen , plan or 5 years.   12/05/2018 - 01/04/2019 Radiation Therapy   Adj RT at [facility] with Dr. Hayden Lipoma: Right whole breast 3D CRT 6 and 18 MV 266 cGy 16 4256 cGy 12/05/2018   12/27/2018  Lumpectomy boost 3D CRT 6 MV 200 cGy 5 1000 cGy 12/28/2018 01/04/2019      CHIEF COMPLIANT: Surveillance for breast cancer  HISTORY OF PRESENT ILLNESS: History of Present Illness Carol Wood "Carol Wood" is a 78 year old female with a history of breast cancer who presents for follow-up after a recent mammogram showing calcifications at the lumpectomy site.  A mammogram on March 24 revealed a 1.4 cm group of likely early dystrophic calcifications at the lumpectomy site. She has opted to wait for a repeat mammogram in six months due to  a previous biopsy experience that resulted in significant bleeding and delayed surgery. She previously took tamoxifen  for 16 months but discontinued it due to intolerance and completed a full course of radiation therapy. There are no new lumps or significant changes in the breast. Bruising occurred from the mammogram procedure.  Family history includes the loss of two sisters and a brother, with one sister dying from COVID-19 complications. There is a history of heart disease, with her mother's brothers passing away in their early fifties.     ALLERGIES:  is allergic to metformin and related, tamoxifen , and janumet [sitagliptin phos-metformin hcl].  MEDICATIONS:  Current Outpatient Medications  Medication Sig Dispense Refill   aspirin EC 81 MG tablet Take 81 mg by mouth daily. Swallow whole.     b complex vitamins tablet Take 1 tablet by mouth every evening.     Blood Glucose Monitoring Suppl (BLOOD GLUCOSE MONITOR SYSTEM) w/Device KIT Use as directed 1 kit 0   carboxymethylcellul-glycerin (LUBRICANT DROPS/DUAL-ACTION) 0.5-0.9 % ophthalmic solution Place 1 drop into both eyes in the morning and at bedtime.     Cholecalciferol (VITAMIN D) 50 MCG (2000 UT) CAPS Take 4,000 Units by mouth every evening.     Cyanocobalamin  (VITAMIN B-12) 5000 MCG TBDP Take 5,000 mcg by mouth every evening.     Evolocumab  (REPATHA  SURECLICK) 140 MG/ML SOAJ Inject 140 mg into the skin every 14 (fourteen) days. 2 mL 11   Flaxseed, Linseed, (FLAXSEED OIL) 1400 MG CAPS Take 1,400 mg by mouth in the morning and at bedtime.     glipiZIDE  (  GLUCOTROL  XL) 10 MG 24 hr tablet Take 1 tablet (10 mg total) by mouth 2 (two) times daily. 180 tablet 4   Glucosamine HCl 1500 MG TABS Take 1,500 mg by mouth in the morning and at bedtime.     ibuprofen (ADVIL) 200 MG tablet Take 800 mg by mouth daily as needed (pain).     insulin  glargine (LANTUS  SOLOSTAR) 100 UNIT/ML Solostar Pen Inject 35 Units into the skin daily as directed.  Use  sliding titration with a max of up to 55 units daily. 45 mL 3   Insulin  Pen Needle (B-D UF III MINI PEN NEEDLES) 31G X 5 MM MISC Inject 1 each into the skin 2 (two) times daily. 200 each 3   Lancets (ONETOUCH DELICA PLUS LANCET33G) MISC Use 1 each 3 (three) times daily as needed. 300 each 0   levothyroxine  (SYNTHROID ) 75 MCG tablet Take 1 tablet (75 mcg total) by mouth daily. 90 tablet 3   lisinopril  (ZESTRIL ) 20 MG tablet Take 20 mg by mouth 2 (two) times daily.     Magnesium 250 MG TABS Take 250 mg by mouth every evening.     metoprolol  succinate (TOPROL -XL) 50 MG 24 hr tablet Take 1 tablet (50 mg total) by mouth daily. 90 tablet 4   Multiple Vitamin (MULTIVITAMIN WITH MINERALS) TABS tablet Take 1 tablet by mouth every evening.     Multiple Vitamins-Minerals (VITEYES AREDS FORMULA PO) Take 1 tablet by mouth 2 (two) times daily.     pantoprazole  (PROTONIX ) 40 MG tablet Take 1 tablet (40 mg total) by mouth daily. 90 tablet 3   No current facility-administered medications for this visit.    PHYSICAL EXAMINATION: ECOG PERFORMANCE STATUS: 1 - Symptomatic but completely ambulatory  Vitals:   11/11/23 1012  BP: (!) 174/66  Pulse: 73  Resp: 16  Temp: 98.3 F (36.8 C)  SpO2: 98%   Filed Weights   11/11/23 1012  Weight: 174 lb 12.8 oz (79.3 kg)    Physical Exam BREAST: Breasts without palpable masses.  (exam performed in the presence of a chaperone)  LABORATORY DATA:  I have reviewed the data as listed    Latest Ref Rng & Units 09/14/2018    9:52 AM  CMP  Glucose 70 - 99 mg/dL 784   BUN 8 - 23 mg/dL 14   Creatinine 6.96 - 1.00 mg/dL 2.95   Sodium 284 - 132 mmol/L 139   Potassium 3.5 - 5.1 mmol/L 4.6   Chloride 98 - 111 mmol/L 106   CO2 22 - 32 mmol/L 23   Calcium 8.9 - 10.3 mg/dL 9.8   Total Protein 6.5 - 8.1 g/dL 7.5   Total Bilirubin 0.3 - 1.2 mg/dL 0.4   Alkaline Phos 38 - 126 U/L 79   AST 15 - 41 U/L 26   ALT 0 - 44 U/L 28     Lab Results  Component Value Date    WBC 7.9 09/14/2018   HGB 14.4 09/14/2018   HCT 47.1 (H) 09/14/2018   MCV 94.6 09/14/2018   PLT 364 09/14/2018   NEUTROABS 4.2 09/14/2018    ASSESSMENT & PLAN:  Ductal carcinoma in situ (DCIS) of right breast 08/17/2018:Screening mammogram [facility] detected distortion in the right breast 12 o'clock position.  Biopsy revealed intermediate grade DCIS with calcifications, ER 100%, PR 100%, Tis NX stage 0   09/22/2018:Right lumpectomy (Dr. Lauralee Poll): DCIS, 1.5cm, intermediate grade, margins negative, ER 100%, PR 100% Tis NX stage 0   Recommendation:  1.  Adjuvant radiation therapy (delayed due to COVID-19) 2.  Other adjuvant antiestrogen therapy with tamoxifen  x5 years.  Started 09/29/2018 discontinued December 2021 because of profound mood swings and hot flashes and depression All of the symptoms have improved.   Her son passed away in 11/15/2018 and her sister passed away in 04-19-22and another sister also passed away since then    Breast cancer surveillance: 1.  Breast exam 11/11/2023: Benign 2.  Mammograms: 09/08/2023: Calcifications measuring 1.4 cm were noted.  We will send her to the breast center for biopsy.  Depending on the result we can see her sooner or we will see her back in 1 year if they are benign.     Return to clinic in 1 year for follow-up ------------------------------------- Assessment and Plan Assessment & Plan Ductal carcinoma in situ (DCIS) of the right breast Completed radiation therapy. Mammogram shows 1.4 cm calcifications at lumpectomy site. Differential includes benign fat necrosis. Biopsy recommended. Unable to tolerate tamoxifen . - Arrange biopsy at [facility] on [address]. - If biopsy negative, no further action required. - If biopsy concerning, follow up for management discussion.      No orders of the defined types were placed in this encounter.  The patient has a good understanding of the overall plan. she agrees with it. she will call  with any problems that may develop before the next visit here. Total time spent: 30 mins including face to face time and time spent for planning, charting and co-ordination of care   Viinay K Brucha Ahlquist, MD 11/11/23

## 2023-11-12 ENCOUNTER — Other Ambulatory Visit: Payer: Self-pay | Admitting: *Deleted

## 2023-11-12 DIAGNOSIS — D0511 Intraductal carcinoma in situ of right breast: Secondary | ICD-10-CM

## 2023-11-18 ENCOUNTER — Other Ambulatory Visit: Payer: Self-pay | Admitting: Hematology and Oncology

## 2023-11-18 DIAGNOSIS — D0511 Intraductal carcinoma in situ of right breast: Secondary | ICD-10-CM

## 2023-11-23 ENCOUNTER — Other Ambulatory Visit: Payer: Self-pay

## 2023-11-23 ENCOUNTER — Other Ambulatory Visit (HOSPITAL_COMMUNITY): Payer: Self-pay

## 2023-11-23 DIAGNOSIS — Z1339 Encounter for screening examination for other mental health and behavioral disorders: Secondary | ICD-10-CM | POA: Diagnosis not present

## 2023-11-23 DIAGNOSIS — Z0000 Encounter for general adult medical examination without abnormal findings: Secondary | ICD-10-CM | POA: Diagnosis not present

## 2023-11-23 DIAGNOSIS — E1122 Type 2 diabetes mellitus with diabetic chronic kidney disease: Secondary | ICD-10-CM | POA: Diagnosis not present

## 2023-11-23 DIAGNOSIS — F0390 Unspecified dementia without behavioral disturbance: Secondary | ICD-10-CM | POA: Diagnosis not present

## 2023-11-23 DIAGNOSIS — Z299 Encounter for prophylactic measures, unspecified: Secondary | ICD-10-CM | POA: Diagnosis not present

## 2023-11-23 DIAGNOSIS — Z6831 Body mass index (BMI) 31.0-31.9, adult: Secondary | ICD-10-CM | POA: Diagnosis not present

## 2023-11-23 DIAGNOSIS — Z1331 Encounter for screening for depression: Secondary | ICD-10-CM | POA: Diagnosis not present

## 2023-11-23 DIAGNOSIS — I10 Essential (primary) hypertension: Secondary | ICD-10-CM | POA: Diagnosis not present

## 2023-11-23 DIAGNOSIS — Z7189 Other specified counseling: Secondary | ICD-10-CM | POA: Diagnosis not present

## 2023-11-23 MED ORDER — DONEPEZIL HCL 5 MG PO TABS
5.0000 mg | ORAL_TABLET | Freq: Every day | ORAL | 3 refills | Status: AC
  Filled 2023-11-23: qty 90, 90d supply, fill #0
  Filled 2024-01-24 – 2024-02-17 (×2): qty 90, 90d supply, fill #1
  Filled 2024-04-01 – 2024-05-07 (×2): qty 90, 90d supply, fill #2

## 2023-11-26 ENCOUNTER — Other Ambulatory Visit

## 2023-11-26 ENCOUNTER — Inpatient Hospital Stay: Admission: RE | Admit: 2023-11-26 | Source: Ambulatory Visit

## 2023-11-26 ENCOUNTER — Ambulatory Visit
Admission: RE | Admit: 2023-11-26 | Discharge: 2023-11-26 | Disposition: A | Discharge status: Home / Self Care | Source: Ambulatory Visit | Attending: Hematology and Oncology | Admitting: Hematology and Oncology

## 2023-11-26 DIAGNOSIS — D0511 Intraductal carcinoma in situ of right breast: Secondary | ICD-10-CM

## 2023-11-26 DIAGNOSIS — N6031 Fibrosclerosis of right breast: Secondary | ICD-10-CM | POA: Diagnosis not present

## 2023-11-26 DIAGNOSIS — R92321 Mammographic fibroglandular density, right breast: Secondary | ICD-10-CM | POA: Diagnosis not present

## 2023-11-26 DIAGNOSIS — R921 Mammographic calcification found on diagnostic imaging of breast: Secondary | ICD-10-CM | POA: Diagnosis not present

## 2023-11-26 HISTORY — PX: BREAST BIOPSY: SHX20

## 2023-11-29 ENCOUNTER — Other Ambulatory Visit (HOSPITAL_COMMUNITY): Payer: Self-pay

## 2023-11-29 LAB — SURGICAL PATHOLOGY

## 2023-12-01 DIAGNOSIS — E7800 Pure hypercholesterolemia, unspecified: Secondary | ICD-10-CM | POA: Diagnosis not present

## 2023-12-03 ENCOUNTER — Other Ambulatory Visit (HOSPITAL_COMMUNITY): Payer: Self-pay

## 2023-12-08 ENCOUNTER — Other Ambulatory Visit (HOSPITAL_COMMUNITY): Payer: Self-pay

## 2023-12-09 ENCOUNTER — Other Ambulatory Visit (HOSPITAL_COMMUNITY): Payer: Self-pay

## 2023-12-09 ENCOUNTER — Other Ambulatory Visit: Payer: Self-pay

## 2023-12-10 ENCOUNTER — Other Ambulatory Visit: Payer: Self-pay

## 2023-12-13 ENCOUNTER — Other Ambulatory Visit: Payer: Self-pay

## 2023-12-13 ENCOUNTER — Other Ambulatory Visit (HOSPITAL_COMMUNITY): Payer: Self-pay

## 2023-12-13 MED ORDER — ONETOUCH VERIO FLEX SYSTEM W/DEVICE KIT
PACK | 0 refills | Status: AC
  Filled 2023-12-13: qty 1, 365d supply, fill #0

## 2023-12-14 ENCOUNTER — Other Ambulatory Visit: Payer: Self-pay

## 2023-12-16 ENCOUNTER — Other Ambulatory Visit (HOSPITAL_COMMUNITY): Payer: Self-pay

## 2023-12-16 ENCOUNTER — Other Ambulatory Visit: Payer: Self-pay

## 2023-12-16 MED ORDER — LISINOPRIL 20 MG PO TABS
20.0000 mg | ORAL_TABLET | Freq: Two times a day (BID) | ORAL | 3 refills | Status: AC
  Filled 2023-12-16: qty 180, 90d supply, fill #0
  Filled 2024-01-24 – 2024-05-07 (×2): qty 180, 90d supply, fill #1

## 2023-12-18 ENCOUNTER — Other Ambulatory Visit (HOSPITAL_COMMUNITY): Payer: Self-pay

## 2023-12-20 ENCOUNTER — Other Ambulatory Visit (HOSPITAL_COMMUNITY): Payer: Self-pay

## 2023-12-20 ENCOUNTER — Encounter: Payer: Self-pay | Admitting: Pharmacist

## 2023-12-20 ENCOUNTER — Other Ambulatory Visit: Payer: Self-pay

## 2023-12-23 ENCOUNTER — Other Ambulatory Visit (HOSPITAL_COMMUNITY): Payer: Self-pay

## 2023-12-23 DIAGNOSIS — E1165 Type 2 diabetes mellitus with hyperglycemia: Secondary | ICD-10-CM | POA: Diagnosis not present

## 2023-12-23 DIAGNOSIS — I10 Essential (primary) hypertension: Secondary | ICD-10-CM | POA: Diagnosis not present

## 2023-12-23 DIAGNOSIS — Z299 Encounter for prophylactic measures, unspecified: Secondary | ICD-10-CM | POA: Diagnosis not present

## 2024-01-06 ENCOUNTER — Other Ambulatory Visit: Payer: Self-pay

## 2024-01-06 ENCOUNTER — Other Ambulatory Visit (HOSPITAL_COMMUNITY): Payer: Self-pay

## 2024-01-06 MED ORDER — ONETOUCH DELICA PLUS LANCET33G MISC
1.0000 | Freq: Three times a day (TID) | 0 refills | Status: DC
  Filled 2024-01-06: qty 300, 100d supply, fill #0

## 2024-01-07 ENCOUNTER — Other Ambulatory Visit (HOSPITAL_COMMUNITY): Payer: Self-pay

## 2024-01-07 ENCOUNTER — Other Ambulatory Visit: Payer: Self-pay

## 2024-01-10 ENCOUNTER — Other Ambulatory Visit (HOSPITAL_COMMUNITY): Payer: Self-pay

## 2024-01-10 ENCOUNTER — Other Ambulatory Visit: Payer: Self-pay

## 2024-01-14 ENCOUNTER — Other Ambulatory Visit (HOSPITAL_COMMUNITY): Payer: Self-pay

## 2024-01-24 ENCOUNTER — Other Ambulatory Visit (HOSPITAL_COMMUNITY): Payer: Self-pay

## 2024-01-25 ENCOUNTER — Other Ambulatory Visit (HOSPITAL_COMMUNITY): Payer: Self-pay

## 2024-01-25 ENCOUNTER — Other Ambulatory Visit: Payer: Self-pay

## 2024-01-25 MED ORDER — METOPROLOL SUCCINATE ER 50 MG PO TB24
50.0000 mg | ORAL_TABLET | Freq: Every day | ORAL | 4 refills | Status: AC
  Filled 2024-02-17: qty 90, 90d supply, fill #0
  Filled 2024-04-01 – 2024-05-29 (×2): qty 90, 90d supply, fill #1

## 2024-01-26 ENCOUNTER — Other Ambulatory Visit (HOSPITAL_COMMUNITY): Payer: Self-pay

## 2024-02-03 ENCOUNTER — Other Ambulatory Visit: Payer: Self-pay

## 2024-02-04 DIAGNOSIS — H524 Presbyopia: Secondary | ICD-10-CM | POA: Diagnosis not present

## 2024-02-04 DIAGNOSIS — H5319 Other subjective visual disturbances: Secondary | ICD-10-CM | POA: Diagnosis not present

## 2024-02-04 DIAGNOSIS — E119 Type 2 diabetes mellitus without complications: Secondary | ICD-10-CM | POA: Diagnosis not present

## 2024-02-04 DIAGNOSIS — H40013 Open angle with borderline findings, low risk, bilateral: Secondary | ICD-10-CM | POA: Diagnosis not present

## 2024-02-04 DIAGNOSIS — H353131 Nonexudative age-related macular degeneration, bilateral, early dry stage: Secondary | ICD-10-CM | POA: Diagnosis not present

## 2024-02-17 ENCOUNTER — Other Ambulatory Visit: Payer: Self-pay

## 2024-02-17 ENCOUNTER — Other Ambulatory Visit (HOSPITAL_COMMUNITY): Payer: Self-pay

## 2024-02-17 MED ORDER — MELOXICAM 7.5 MG PO TABS
7.5000 mg | ORAL_TABLET | Freq: Every day | ORAL | 3 refills | Status: AC | PRN
  Filled 2024-02-17: qty 90, 90d supply, fill #0
  Filled 2024-04-01 – 2024-05-07 (×2): qty 90, 90d supply, fill #1

## 2024-03-02 ENCOUNTER — Other Ambulatory Visit: Payer: Self-pay

## 2024-03-03 ENCOUNTER — Other Ambulatory Visit: Payer: Self-pay

## 2024-04-01 ENCOUNTER — Other Ambulatory Visit (HOSPITAL_COMMUNITY): Payer: Self-pay

## 2024-04-02 ENCOUNTER — Other Ambulatory Visit (HOSPITAL_COMMUNITY): Payer: Self-pay

## 2024-04-02 MED ORDER — PANTOPRAZOLE SODIUM 40 MG PO TBEC
40.0000 mg | DELAYED_RELEASE_TABLET | Freq: Every day | ORAL | 3 refills | Status: AC
  Filled 2024-04-02: qty 90, 90d supply, fill #0

## 2024-04-03 ENCOUNTER — Other Ambulatory Visit: Payer: Self-pay

## 2024-04-10 ENCOUNTER — Other Ambulatory Visit (HOSPITAL_COMMUNITY): Payer: Self-pay

## 2024-04-10 MED ORDER — ACCU-CHEK SOFTCLIX LANCETS MISC
3 refills | Status: AC
  Filled 2024-04-10: qty 200, 100d supply, fill #0

## 2024-04-12 ENCOUNTER — Encounter (INDEPENDENT_AMBULATORY_CARE_PROVIDER_SITE_OTHER): Payer: Self-pay | Admitting: Gastroenterology

## 2024-04-17 DIAGNOSIS — N1831 Chronic kidney disease, stage 3a: Secondary | ICD-10-CM | POA: Diagnosis not present

## 2024-04-17 DIAGNOSIS — R52 Pain, unspecified: Secondary | ICD-10-CM | POA: Diagnosis not present

## 2024-04-17 DIAGNOSIS — R109 Unspecified abdominal pain: Secondary | ICD-10-CM | POA: Diagnosis not present

## 2024-04-17 DIAGNOSIS — I10 Essential (primary) hypertension: Secondary | ICD-10-CM | POA: Diagnosis not present

## 2024-04-17 DIAGNOSIS — Z299 Encounter for prophylactic measures, unspecified: Secondary | ICD-10-CM | POA: Diagnosis not present

## 2024-04-21 DIAGNOSIS — R1011 Right upper quadrant pain: Secondary | ICD-10-CM | POA: Diagnosis not present

## 2024-04-21 DIAGNOSIS — R109 Unspecified abdominal pain: Secondary | ICD-10-CM | POA: Diagnosis not present

## 2024-05-06 ENCOUNTER — Other Ambulatory Visit (HOSPITAL_COMMUNITY): Payer: Self-pay

## 2024-05-07 ENCOUNTER — Other Ambulatory Visit (HOSPITAL_COMMUNITY): Payer: Self-pay

## 2024-05-08 ENCOUNTER — Other Ambulatory Visit (HOSPITAL_COMMUNITY): Payer: Self-pay

## 2024-05-08 ENCOUNTER — Other Ambulatory Visit: Payer: Self-pay

## 2024-05-08 MED ORDER — REPATHA SURECLICK 140 MG/ML ~~LOC~~ SOAJ
140.0000 mg | SUBCUTANEOUS | 11 refills | Status: AC
  Filled 2024-05-08: qty 2, 28d supply, fill #0
  Filled 2024-05-29: qty 2, 28d supply, fill #1
  Filled 2024-07-06: qty 2, 28d supply, fill #2

## 2024-05-08 MED ORDER — LANTUS SOLOSTAR 100 UNIT/ML ~~LOC~~ SOPN
55.0000 [IU] | PEN_INJECTOR | Freq: Every day | SUBCUTANEOUS | 3 refills | Status: AC
  Filled 2024-05-08: qty 45, 81d supply, fill #0

## 2024-05-08 MED ORDER — ONETOUCH DELICA PLUS LANCET33G MISC
4 refills | Status: AC
  Filled 2024-05-08: qty 300, 100d supply, fill #0

## 2024-05-08 MED ORDER — GLIPIZIDE ER 10 MG PO TB24
10.0000 mg | ORAL_TABLET | Freq: Two times a day (BID) | ORAL | 4 refills | Status: AC
  Filled 2024-05-08: qty 180, 90d supply, fill #0

## 2024-05-09 ENCOUNTER — Other Ambulatory Visit (HOSPITAL_COMMUNITY): Payer: Self-pay

## 2024-05-11 DIAGNOSIS — E039 Hypothyroidism, unspecified: Secondary | ICD-10-CM | POA: Diagnosis not present

## 2024-05-11 DIAGNOSIS — I10 Essential (primary) hypertension: Secondary | ICD-10-CM | POA: Diagnosis not present

## 2024-05-11 DIAGNOSIS — Z79899 Other long term (current) drug therapy: Secondary | ICD-10-CM | POA: Diagnosis not present

## 2024-05-11 DIAGNOSIS — Z6832 Body mass index (BMI) 32.0-32.9, adult: Secondary | ICD-10-CM | POA: Diagnosis not present

## 2024-05-11 DIAGNOSIS — Z299 Encounter for prophylactic measures, unspecified: Secondary | ICD-10-CM | POA: Diagnosis not present

## 2024-05-11 DIAGNOSIS — E119 Type 2 diabetes mellitus without complications: Secondary | ICD-10-CM | POA: Diagnosis not present

## 2024-05-11 DIAGNOSIS — R5383 Other fatigue: Secondary | ICD-10-CM | POA: Diagnosis not present

## 2024-05-11 DIAGNOSIS — E7800 Pure hypercholesterolemia, unspecified: Secondary | ICD-10-CM | POA: Diagnosis not present

## 2024-05-11 DIAGNOSIS — Z0000 Encounter for general adult medical examination without abnormal findings: Secondary | ICD-10-CM | POA: Diagnosis not present

## 2024-05-29 ENCOUNTER — Other Ambulatory Visit (HOSPITAL_COMMUNITY): Payer: Self-pay

## 2024-07-06 ENCOUNTER — Other Ambulatory Visit (HOSPITAL_COMMUNITY): Payer: Self-pay

## 2024-07-06 ENCOUNTER — Other Ambulatory Visit: Payer: Self-pay

## 2024-11-09 ENCOUNTER — Inpatient Hospital Stay: Admitting: Hematology and Oncology
